# Patient Record
Sex: Female | Born: 1960 | Race: Black or African American | Hispanic: No | Marital: Single | Smoking: Former smoker
Health system: Southern US, Community
[De-identification: ages and names within clinical notes are randomized; demographics above are authoritative.]

## PROBLEM LIST (undated history)

## (undated) DIAGNOSIS — E119 Type 2 diabetes mellitus without complications: Secondary | ICD-10-CM

## (undated) DIAGNOSIS — E785 Hyperlipidemia, unspecified: Secondary | ICD-10-CM

## (undated) DIAGNOSIS — H269 Unspecified cataract: Secondary | ICD-10-CM

## (undated) DIAGNOSIS — E538 Deficiency of other specified B group vitamins: Secondary | ICD-10-CM

## (undated) DIAGNOSIS — I10 Essential (primary) hypertension: Secondary | ICD-10-CM

## (undated) HISTORY — PX: BREAST BIOPSY: SHX20

## (undated) HISTORY — DX: Unspecified cataract: H26.9

## (undated) HISTORY — PX: BILATERAL KNEE ARTHROSCOPY: SUR91

## (undated) HISTORY — DX: Deficiency of other specified B group vitamins: E53.8

## (undated) HISTORY — DX: Hyperlipidemia, unspecified: E78.5

## (undated) HISTORY — PX: CATARACT EXTRACTION W/ INTRAOCULAR LENS IMPLANT: SHX1309

---

## 2002-04-24 ENCOUNTER — Encounter: Payer: Self-pay | Admitting: Emergency Medicine

## 2002-04-24 ENCOUNTER — Emergency Department (HOSPITAL_COMMUNITY): Admission: EM | Admit: 2002-04-24 | Discharge: 2002-04-24 | Payer: Self-pay | Admitting: Emergency Medicine

## 2005-09-10 ENCOUNTER — Ambulatory Visit (HOSPITAL_BASED_OUTPATIENT_CLINIC_OR_DEPARTMENT_OTHER): Admission: RE | Admit: 2005-09-10 | Discharge: 2005-09-10 | Payer: Self-pay | Admitting: Orthopedic Surgery

## 2005-09-10 ENCOUNTER — Ambulatory Visit (HOSPITAL_COMMUNITY): Admission: RE | Admit: 2005-09-10 | Discharge: 2005-09-10 | Payer: Self-pay | Admitting: Orthopedic Surgery

## 2007-03-02 ENCOUNTER — Emergency Department (HOSPITAL_COMMUNITY): Admission: EM | Admit: 2007-03-02 | Discharge: 2007-03-02 | Payer: Self-pay | Admitting: Emergency Medicine

## 2007-09-06 ENCOUNTER — Ambulatory Visit (HOSPITAL_COMMUNITY): Admission: RE | Admit: 2007-09-06 | Discharge: 2007-09-07 | Payer: Self-pay | Admitting: Ophthalmology

## 2009-01-10 ENCOUNTER — Ambulatory Visit (HOSPITAL_COMMUNITY): Admission: RE | Admit: 2009-01-10 | Discharge: 2009-01-11 | Payer: Self-pay | Admitting: Obstetrics and Gynecology

## 2011-04-13 LAB — CBC
Hemoglobin: 9.2 g/dL — ABNORMAL LOW (ref 12.0–15.0)
MCHC: 33 g/dL (ref 30.0–36.0)
RBC: 3.09 MIL/uL — ABNORMAL LOW (ref 3.87–5.11)
WBC: 9.7 10*3/uL (ref 4.0–10.5)

## 2011-04-13 LAB — BASIC METABOLIC PANEL
BUN: 26 mg/dL — ABNORMAL HIGH (ref 6–23)
CO2: 21 mEq/L (ref 19–32)
Calcium: 8.6 mg/dL (ref 8.4–10.5)
Chloride: 108 mEq/L (ref 96–112)
Creatinine, Ser: 1.03 mg/dL (ref 0.4–1.2)
GFR calc Af Amer: >60 mL/min (ref >60)
GFR calc non Af Amer: 58 mL/min — ABNORMAL LOW (ref >60)
Glucose, Bld: 97 mg/dL (ref 70–99)
Potassium: 4.1 mEq/L (ref 3.5–5.1)
Sodium: 141 mEq/L (ref 135–145)

## 2011-04-13 LAB — GLUCOSE, CAPILLARY
Glucose-Capillary: 101 mg/dL — ABNORMAL HIGH (ref 70–99)
Glucose-Capillary: 104 mg/dL — ABNORMAL HIGH (ref 70–99)
Glucose-Capillary: 134 mg/dL — ABNORMAL HIGH (ref 70–99)
Glucose-Capillary: 149 mg/dL — ABNORMAL HIGH (ref 70–99)
Glucose-Capillary: 96 mg/dL (ref 70–99)

## 2011-04-13 LAB — DIFFERENTIAL
Lymphocytes Relative: 23 % (ref 12–46)
Lymphs Abs: 2.2 10*3/uL (ref 0.7–4.0)
Monocytes Absolute: 0.6 10*3/uL (ref 0.1–1.0)
Monocytes Relative: 6 % (ref 3–12)
Neutro Abs: 6 10*3/uL (ref 1.7–7.7)
Neutrophils Relative %: 62 % (ref 43–77)

## 2011-05-12 NOTE — Op Note (Signed)
NAMEJAYLE, Morgan [name] NO.:  [id number]   MEDICAL RECORD NO.:  [id number]          PATIENT TYPE:  AMB   LOCATION:  SDS                          FACILITY:  [facility]   PHYSICIAN:  John D. Ashley Royalty, M.D. DATE OF BIRTH:  June 14, 1961   DATE OF PROCEDURE:  09/06/2007  DATE OF DISCHARGE:                               OPERATIVE REPORT   ADMISSION DIAGNOSIS:  Traction retinal detachment, proliferative  diabetic retinopathy, vitreous hemorrhage, complex traction retinal  detachment, left eye.   PROCEDURES:  Repair of complex traction retinal detachment, pars plana  vitrectomy with membrane peel, retinal photocoagulation in the left eye.   SURGEON:  Alan Mulder, MD   ASSISTANT:  Rosalie Doctor, MA   ANESTHESIA:  General.   DETAILS:  Usual prep and drape, peritomies at 10, 2 and 4 o'clock.  The  5-mm infusion port was anchored into place at 4 o'clock.  Provisc was  placed on the corneal surface and the biome viewing system was moved  into place.  The lighted pick and cutter were placed at 10 and 2  o'clock, respectively.  Pars plana vitrectomy was begun just behind the  crystalline lens.  A cataract was noted.  Large pockets of dark red  blood were encountered and carefully unroofed with the vitreous pick and  cutter.  Fluid was vacuumed from beneath these large pockets and a ring  traction detachment extending from the disk to the lower arcade, to the  temporal arcade, to the superior arcade and back to the disk was  encountered.  This large fibrous ring was causing traction retinal  detachment.  A macular hole was present.  The [facility] scissors were used to  incise the ring.  [employer] scissors were used to remove small  adhesions of the ring to the retina itself.  The serrated forceps were  used to and the membrane was grasped by its attachment at the disk and  peeled toward the temporal side.  The vitreous cutter was used to trim  the membrane down to the retinal  surface after it had been incised  multiple times.  [employer] diamond-dusted membrane scraper was  used to remove the internal limiting membrane around the macular hole.  The vitrectomy was carried out into the periphery and down to the  vitreous base for 360 degrees.  All blood and vitreous were removed.  The endolaser was positioned in the eye and 981 burns were placed around  the retinal periphery at a power of 1000 milliwatts, 1000 microns each  and 0.1 seconds each.  A washout procedure was performed and the [employer] brush was used to vacuum blood from the retinal surface.  The instruments were removed from the eye and 9-0 nylon was used to  close the sclerotomy sites.  The conjunctiva was closed with wet-field  cautery.  Polymyxin and gentamicin were irrigated into Tenon's space.  Atropine solution was applied.  Marcaine was injected  around the globe for postop pain.  Decadron 10 mg was injected into the  lower subconjunctival space.  TobraDex ophthalmic  ointment, a patch and  shield were placed.  The patient was awakened and taken to Recovery in  satisfactory condition.      Beulah Gandy. Ashley Royalty, M.D.  Electronically Signed     JDM/MEDQ  D:  09/06/2007  T:  09/07/2007  Job:  [id number]

## 2011-05-12 NOTE — Op Note (Signed)
[name], [name] NO.:  [id number]   MEDICAL RECORD NO.:  [id number]          PATIENT TYPE:  AMB   LOCATION:  SDS                          FACILITY:  [facility]   PHYSICIAN:  John D. Ashley Royalty, M.D. DATE OF BIRTH:  [birth year]   DATE OF PROCEDURE:  01/10/2009  DATE OF DISCHARGE:                               OPERATIVE REPORT   ADMISSION DIAGNOSES:  Traction retinal detachment, vitreous hemorrhage,  proliferative diabetic retinopathy, and complex retinal detachment,  right eye.   PROCEDURES:  Repair of complex retinal detachment, right eye with pars  plana vitrectomy, membrane peel, retinal photocoagulation, and gas-fluid  exchange.   SURGEON:  Beulah Gandy. Ashley Royalty, MD   ASSISTANT:  Rosalie Doctor, MA   ANESTHESIA:  General.   PROCEDURE IN DETAIL:  After usual prep and drape, a 25-gauge trocar was  placed at 8, 10, and 2 o'clock.  Infusion at 8 o'clock.  Provisc placed  on the corneal surface and the BIOM viewing system moved into place.  Pars plana vitrectomy was begun just behind the cataractous lens.  The  view was difficult because of the cataract.  Blood mixed with vitreous  was then encountered.  This was carefully removed under low suction and  rapid cutting.  As the posterior segment came into view, two large areas  of traction detachment were seen on the temporal side of the macula.  There was surface proliferation with dense vitreous hemorrhage and  neovascularization in these complexes.  The complexes were treated with  the vitreous cutter and the lighted pick was used to engage the vitreous  surface with forceps.  A 25-gauge forceps were used to grasp the  vitreous hyaloid as the cutter and pick were used to peel these vitreous  and neovascular tissue from the retina itself.  This was done in the  superotemporal area and then in the inferotemporal area.  These areas  were attached to each other and then they attached to the disk.  The  surface  proliferation was removed all the way to the disk, uncovering  the foveal region.  Once the proliferative areas were removed, the  endolaser was positioned in the eye and 920 burns were placed to obtain  hemostasis and panretinal photocoagulation.  The power was 1000  milliwatts, 1000 microns each and 0.1 seconds each.  The vitrectomy was  carried out into the periphery down to the vitreous base for 360  degrees.  It was trimmed in this area.  Scleral depression was used to  gain access to this region and blood was removed from the far periphery.  A subtotal gas-fluid exchange was then carried out through the infusion  port and suctioned.  The silicone-tip suction line was used to vacuum  blood from the retinal surface.  The instruments were removed from the  eye and additional gas was injected to obtain a closing pressure of 10  with a Barraquer keratometer.  The conjunctiva was allowed to slide over  the scleral wounds and the wounds were found to be tight.  Polymyxin and  gentamicin were  irrigated into Tenon space.  Atropine solution was  applied.  Decadron 10 mg was injected into the lower subconjunctival  space.  Marcaine was injected around the globe for postop pain.  Polysporin ophthalmic ointment, a patch and shield were placed.  The  patient was awakened and taken to recovery in satisfactory condition.   COMPLICATIONS:  None.   DURATION:  40 minutes.      Beulah Gandy. Ashley Royalty, M.D.  Electronically Signed     JDM/MEDQ  D:  01/10/2009  T:  01/11/2009  Job:  [id number]

## 2011-05-15 NOTE — Op Note (Signed)
NAMEKENYADA, Morgan [name] NO.:  [id number]   MEDICAL RECORD NO.:  [id number]          PATIENT TYPE:  AMB   LOCATION:  [facility]C                         FACILITY:  [facility]   PHYSICIAN:  Marlowe Kays, M.D.  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   POSTOPERATIVE DIAGNOSES:  1.  Osteoarthritis and large osteochondral fragment in suprapatellar pouch,      right knee.  2.  Torn medial and lateral menisci.   OPERATIONS:  1.  Right knee arthroscopy with removal of large osteochondral fragment and      burring of large osteophyte from superior medial femoral condyle.  2.  Partial medial and lateral meniscectomy.   SURGEON:  Marlowe Kays, M.D.   ASSISTANT:  Nurse.   ANESTHESIA:  General.   JUSTIFICATION FOR PROCEDURE:  She has a history of bilateral knee  arthroscopies back in the 1980s.  Recently she has had pain and swelling in  the knee and can feel something moving in the paramedial joint which on x-  ray which is a large osteochondral fragment.   DESCRIPTION OF PROCEDURE:  She has a history of bilateral knee arthroscopies  back and 98 days was this had pain, swelling and they with and can feel  something moving in the superior medial joint which on x-ray used a large  osteochondral fragment.  She is here today for removal of the fragment with  other treatment as necessary for the interior of the knee joint.  She and  her  family understand that she has a very arthritic knee.   DESCRIPTION OF PROCEDURE:  Satisfactory general anesthesia, pneumatic  tourniquet.  Right leg was Esmarch out nonsterilely, prepped with DuraPrep  down to the ankle and draped in a sterile field.  Thigh stabilizer employed.  Knee support and Ace wrap to the left leg.  Superior and medial  saline  inflow first through an anterolateral portal, the medial compartment of the  knee joint was evaluated.  She had good bit of synovitis present which I  resected with 3.5 mm shaver.  She had several areas of superficial tears  along the residual medial meniscus which I shaved down until smooth.  She  had full-thickness wear of the posterior medial tibial plateau and a large  defect on the medial femoral condyle which was most likely the source of the  osteochondral fragment.  It seemed to match up reasonably well.  Looking at  the medial gutter and suprapatellar area, she had a large osteophyte off the  superior medial femoral condyle and a large free fragment in the joint.  There was some minimal wear of the patella.  I  elected to go ahead and  finish the lateral compartment of the knee joint first for reasons noted  below.  I then switched portals laterally.  She also had a large amount of  synovitis present with  fairly extensive tearing of the lateral meniscus  which I pictured and then shaved down the meniscus to a stable rim as well  as cleaning up the synovitis with some minimal shaving of the lateral  femoral condyle.  We then reversed portals once again and enlarged the  anterior and medial portal to allow for better retrieval of the large  fragment.  I then introduced a 4-0 oval bur and burred down the large  osteophyte off the medial femoral condyle until it was smooth.  I then was  able to grasp the osteochondral fragment which was actually too large for  the pituitary rongeur and bring it down into the medial compartment,  slightly enlarging the medial portal with knife blade.  I was unable to  bring to deliver it with the fragment breaking down and through with  pituitary pressure.  I tried using a larger more aggressive shaver but was  unable to really remove it making progress with it.  I then went and used  the bur, breaking the fragment down in smaller pieces which I  was then able  to retrieve piecemeal with the pituitary until all of it had been removed.  I then placed the shaver back in the joint and used it throughout the joint  be sure that there were no loose fragments floating around.  The knee joint  was then irrigated until clear and all fluid possible removed.  The two  anterior portals were closed with 4-0 nylon.  Through the inflow apparatus,  I injected 20 mL of 0.5% Marcaine with Adrenalin, 4 mg of morphine.  I then  sutured this portal with 4-0 nylon as well.  Betadine, Adaptic and dry  sterile dressing were applied and tourniquet was released.  She tolerated  the procedure well and was taken to recovery in satisfactory condition with  no known complication.           ______________________________  Marlowe Kays, M.D.     JA/MEDQ  D:  09/10/2005  T:  09/10/2005  Job:  [id number]

## 2011-10-09 LAB — BASIC METABOLIC PANEL
CO2: 28
Calcium: 9.5
Creatinine, Ser: 0.74
GFR calc Af Amer: >60

## 2011-10-09 LAB — CBC
MCHC: 34.2
RBC: 3.78 — ABNORMAL LOW

## 2011-11-02 ENCOUNTER — Ambulatory Visit (INDEPENDENT_AMBULATORY_CARE_PROVIDER_SITE_OTHER): Payer: Self-pay | Admitting: Ophthalmology

## 2012-12-21 ENCOUNTER — Encounter (HOSPITAL_COMMUNITY): Payer: Self-pay | Admitting: *Deleted

## 2012-12-21 ENCOUNTER — Emergency Department (HOSPITAL_COMMUNITY)
Admission: EM | Admit: 2012-12-21 | Discharge: 2012-12-21 | Disposition: A | Discharge status: Home / Self Care | Payer: BC Managed Care – PPO | Attending: Emergency Medicine | Admitting: Emergency Medicine

## 2012-12-21 DIAGNOSIS — E119 Type 2 diabetes mellitus without complications: Secondary | ICD-10-CM

## 2012-12-21 DIAGNOSIS — R61 Generalized hyperhidrosis: Secondary | ICD-10-CM | POA: Insufficient documentation

## 2012-12-21 DIAGNOSIS — I10 Essential (primary) hypertension: Secondary | ICD-10-CM

## 2012-12-21 DIAGNOSIS — R110 Nausea: Secondary | ICD-10-CM | POA: Insufficient documentation

## 2012-12-21 DIAGNOSIS — Z79899 Other long term (current) drug therapy: Secondary | ICD-10-CM | POA: Insufficient documentation

## 2012-12-21 HISTORY — DX: Type 2 diabetes mellitus without complications: E11.9

## 2012-12-21 HISTORY — DX: Essential (primary) hypertension: I10

## 2012-12-21 LAB — GLUCOSE, CAPILLARY: Glucose-Capillary: 532 mg/dL — ABNORMAL HIGH (ref 70–99)

## 2012-12-21 MED ORDER — LISINOPRIL 20 MG PO TABS: 20.0000 mg | ORAL_TABLET | Freq: Every day | ORAL | Status: DC

## 2012-12-21 MED ORDER — LISINOPRIL 5 MG PO TABS
5.0000 mg | ORAL_TABLET | Freq: Once | ORAL | Status: AC
  Administered 2012-12-21: 5 mg via ORAL
  Filled 2012-12-21: qty 1

## 2012-12-21 MED ORDER — GLIMEPIRIDE 2 MG PO TABS
2.0000 mg | ORAL_TABLET | Freq: Every day | ORAL | Status: DC
  Administered 2012-12-21: 2 mg via ORAL
  Filled 2012-12-21 (×2): qty 1

## 2012-12-21 MED ORDER — METFORMIN HCL 500 MG PO TABS
1000.0000 mg | ORAL_TABLET | Freq: Once | ORAL | Status: AC
  Administered 2012-12-21: 1000 mg via ORAL
  Filled 2012-12-21: qty 2

## 2012-12-21 MED ORDER — METFORMIN HCL 500 MG PO TABS: 500.0000 mg | ORAL_TABLET | Freq: Two times a day (BID) | ORAL | Status: DC

## 2012-12-21 MED ORDER — GLIMEPIRIDE 2 MG PO TABS: 2.0000 mg | ORAL_TABLET | Freq: Every day | ORAL | Status: DC

## 2012-12-21 NOTE — ED Provider Notes (Signed)
History  This chart was scribed for Donnetta Hutching, MD by Manuela Schwartz, ED scribe. This patient was seen in room APA09/APA09 and the patient's care was started at 1438.   CSN: [id number]  Arrival date & time 12/21/12  1438   First MD Initiated Contact with Patient 12/21/12 1513      Chief Complaint  Patient presents with  . Hyperglycemia   Patient is a 51 y.o. female presenting with diabetes problem. The history is provided by the patient. No language interpreter was used.  Diabetes She presents for her initial diabetic visit. Her disease course has been fluctuating. Hypoglycemia symptoms include sweats. Pertinent negatives for hypoglycemia include no confusion or seizures. Pertinent negatives for diabetes include no blurred vision, no visual change and no weakness. Symptoms are stable. Symptoms have been present for 1 day. Risk factors for coronary artery disease include diabetes mellitus. When asked about current treatments, none were reported. She is compliant with treatment some of the time. Her home blood glucose trend is increasing steadily. Her highest blood glucose is >200 mg/dl.   Morgan [name] is a 51 y.o. female who presents to the Emergency Department complaining of elevated blood sugar today associated with not taking her Rx lately b/c she didn't get them filled. She usually takes metformin 500 mg x 2/day, Glimepiride 2 mg daily, lisinopril 5 mg daily. She states she checked her BS b/c began feeling diaphoretic and nauseated, no emesis. CBG was 512 at home and 532 here at triage.  Her PCP is Dr. Regino Schultze Past Medical History  Diagnosis Date  . Diabetes mellitus without complication   . Hypertension     History reviewed. No pertinent past surgical history.  History reviewed. No pertinent family history.  History  Substance Use Topics  . Smoking status: Never Smoker   . Smokeless tobacco: Not on file  . Alcohol Use: No    OB History    Grav Para Term Preterm Abortions TAB SAB  Ect Mult Living                  Review of Systems  Constitutional: Negative for fever and chills.       512 CBG at home  Eyes: Negative for blurred vision.  Respiratory: Negative for shortness of breath.   Gastrointestinal: Negative for nausea and vomiting.  Neurological: Negative for seizures and weakness.  Psychiatric/Behavioral: Negative for confusion.  All other systems reviewed and are negative.    Allergies  Aspirin  Home Medications   Current Outpatient Rx  Name  Route  Sig  Dispense  Refill  . B-12 1000 MCG PO CAPS   Oral   Take 1 capsule by mouth daily.         Marland Kitchen GLIMEPIRIDE 2 MG PO TABS   Oral   Take 2 mg by mouth daily before breakfast.         . LISINOPRIL 5 MG PO TABS   Oral   Take 5 mg by mouth daily.         Marland Kitchen METFORMIN HCL 500 MG PO TABS   Oral   Take 500 mg by mouth 2 (two) times daily with a meal.           Triage Vitals: BP 177/81  Pulse 83  Temp 97.9 F (36.6 C) (Oral)  Resp 18  Ht 5\' 7"  (1.702 m)  Wt 172 lb (78.019 kg)  BMI 26.94 kg/m2  SpO2 100%  LMP 11/24/2012  Physical Exam  Nursing  note and vitals reviewed. Constitutional: She is oriented to person, place, and time. She appears well-developed and well-nourished.  HENT:  Head: Normocephalic and atraumatic.  Eyes: Conjunctivae normal and EOM are normal. Pupils are equal, round, and reactive to light.  Neck: Normal range of motion. Neck supple.  Cardiovascular: Normal rate, regular rhythm and normal heart sounds.   Pulmonary/Chest: Effort normal and breath sounds normal.  Abdominal: Soft. Bowel sounds are normal.  Musculoskeletal: Normal range of motion.  Neurological: She is alert and oriented to person, place, and time.  Skin: Skin is warm and dry.  Psychiatric: She has a normal mood and affect.    ED Course  Procedures (including critical care time) DIAGNOSTIC STUDIES: Oxygen Saturation is 100% on room air, normal by my interpretation.    COORDINATION OF  CARE: At 340 PM Discussed treatment plan with patient which includes CGB, glimepiride (AMARYL) tablet 2 mg, lisinopril (PRINIVIL,ZESTRIL) tablet 5 mg, metFORMIN (GLUCOPHAGE) tablet 1,000 mg. Patient agrees.         Labs Reviewed  GLUCOSE, CAPILLARY - Abnormal; Notable for the following:    Glucose-Capillary 532 (*)     All other components within normal limits   No results found.   No diagnosis found.    MDM   Patient does not feel ill at this time. She has not been taking her oral hypoglycemics. She understands the importance of this.  Refill metformin, Amaryl, Lisinopril.  Patient and her sister understand treatment plan.  First dose of medication given in emergency department     I personally performed the services described in this documentation, which was scribed in my presence. The recorded information has been reviewed and is accurate.          Donnetta Hutching, MD 12/21/12 [phone]

## 2012-12-21 NOTE — ED Notes (Signed)
amaryl being obtained from pharmacy

## 2012-12-21 NOTE — ED Notes (Signed)
Pt checked sugar and first time registered too high and second time in 500's

## 2012-12-21 NOTE — ED Notes (Signed)
CBG in triage 532, pt states she has not taken diabetes med like she is suppose to

## 2013-03-01 ENCOUNTER — Other Ambulatory Visit (HOSPITAL_COMMUNITY): Payer: Self-pay | Admitting: Physician Assistant

## 2013-03-01 DIAGNOSIS — Z0000 Encounter for general adult medical examination without abnormal findings: Secondary | ICD-10-CM

## 2013-03-06 ENCOUNTER — Ambulatory Visit (HOSPITAL_COMMUNITY): Payer: BC Managed Care – PPO

## 2013-03-07 ENCOUNTER — Telehealth: Payer: Self-pay

## 2013-03-07 NOTE — Telephone Encounter (Signed)
LMOM to call.

## 2013-03-09 NOTE — Telephone Encounter (Signed)
LMOM to call.

## 2013-03-10 NOTE — Telephone Encounter (Signed)
Letter to pt

## 2013-03-13 ENCOUNTER — Ambulatory Visit (HOSPITAL_COMMUNITY)
Admission: RE | Admit: 2013-03-13 | Discharge: 2013-03-13 | Disposition: A | Discharge status: Home / Self Care | Payer: BC Managed Care – PPO | Source: Ambulatory Visit | Attending: Physician Assistant | Admitting: Physician Assistant

## 2013-03-13 DIAGNOSIS — Z1231 Encounter for screening mammogram for malignant neoplasm of breast: Secondary | ICD-10-CM | POA: Insufficient documentation

## 2013-03-13 DIAGNOSIS — Z0000 Encounter for general adult medical examination without abnormal findings: Secondary | ICD-10-CM

## 2013-03-15 NOTE — Telephone Encounter (Signed)
Letter to PCP

## 2013-04-03 ENCOUNTER — Encounter: Payer: Self-pay | Admitting: *Deleted

## 2013-04-17 ENCOUNTER — Encounter (HOSPITAL_COMMUNITY): Payer: BC Managed Care – PPO | Attending: Oncology | Admitting: Oncology

## 2013-04-17 ENCOUNTER — Encounter (HOSPITAL_COMMUNITY): Payer: Self-pay | Admitting: Oncology

## 2013-04-17 VITALS — BP 145/85 | HR 91 | Temp 98.3°F | Resp 20 | Wt 162.6 lb

## 2013-04-17 DIAGNOSIS — D638 Anemia in other chronic diseases classified elsewhere: Secondary | ICD-10-CM | POA: Insufficient documentation

## 2013-04-17 DIAGNOSIS — I10 Essential (primary) hypertension: Secondary | ICD-10-CM | POA: Insufficient documentation

## 2013-04-17 DIAGNOSIS — E538 Deficiency of other specified B group vitamins: Secondary | ICD-10-CM

## 2013-04-17 DIAGNOSIS — E119 Type 2 diabetes mellitus without complications: Secondary | ICD-10-CM | POA: Insufficient documentation

## 2013-04-17 DIAGNOSIS — N289 Disorder of kidney and ureter, unspecified: Secondary | ICD-10-CM | POA: Insufficient documentation

## 2013-04-17 DIAGNOSIS — E785 Hyperlipidemia, unspecified: Secondary | ICD-10-CM | POA: Insufficient documentation

## 2013-04-17 DIAGNOSIS — D649 Anemia, unspecified: Secondary | ICD-10-CM

## 2013-04-17 HISTORY — DX: Deficiency of other specified B group vitamins: E53.8

## 2013-04-17 LAB — CBC WITH DIFFERENTIAL/PLATELET
Basophils Absolute: 0 10*3/uL (ref 0.0–0.1)
Eosinophils Relative: 3 % (ref 0–5)
HCT: 32.2 % — ABNORMAL LOW (ref 36.0–46.0)
Hemoglobin: 10.6 g/dL — ABNORMAL LOW (ref 12.0–15.0)
Lymphocytes Relative: 24 % (ref 12–46)
MCHC: 32.9 g/dL (ref 30.0–36.0)
MCV: 91.5 fL (ref 78.0–100.0)
Monocytes Absolute: 0.4 10*3/uL (ref 0.1–1.0)
Monocytes Relative: 7 % (ref 3–12)
Neutro Abs: 4 10*3/uL (ref 1.7–7.7)
RDW: 13.8 % (ref 11.5–15.5)
WBC: 6.1 10*3/uL (ref 4.0–10.5)

## 2013-04-17 LAB — RETICULOCYTES: Retic Count, Absolute: 95 10*3/uL (ref 19.0–186.0)

## 2013-04-17 LAB — COMPREHENSIVE METABOLIC PANEL
ALT: 6 U/L (ref 0–35)
AST: 14 U/L (ref 0–37)
Alkaline Phosphatase: 46 U/L (ref 39–117)
CO2: 21 mEq/L (ref 19–32)
Chloride: 104 mEq/L (ref 96–112)
GFR calc Af Amer: 45 mL/min — ABNORMAL LOW (ref >90)
GFR calc non Af Amer: 39 mL/min — ABNORMAL LOW (ref >90)
Glucose, Bld: 86 mg/dL (ref 70–99)
Sodium: 135 mEq/L (ref 135–145)
Total Bilirubin: 0.1 mg/dL — ABNORMAL LOW (ref 0.3–1.2)

## 2013-04-17 NOTE — Patient Instructions (Addendum)
[facility] Discharge Instructions  RECOMMENDATIONS MADE BY THE CONSULTANT AND ANY TEST RESULTS WILL BE SENT TO YOUR REFERRING PHYSICIAN.  HOLD all vitamin B12 supplements. Lab work today. We will call you with any abnormal results. Return to clinic in 5 months for lab work. Return to clinic to see physician after 5 month lab work.  Thank you for choosing [facility] to provide your oncology and hematology care.  To afford each patient quality time with our providers, please arrive at least 15 minutes before your scheduled appointment time.  With your help, our goal is to use those 15 minutes to complete the necessary work-up to ensure our physicians have the information they need to help with your evaluation and healthcare recommendations.    Effective January 1st, 2014, we ask that you re-schedule your appointment with our physicians should you arrive 10 or more minutes late for your appointment.  We strive to give you quality time with our providers, and arriving late affects you and other patients whose appointments are after yours.    Again, thank you for choosing [facility].  Our hope is that these requests will decrease the amount of time that you wait before being seen by our physicians.       _____________________________________________________________  Should you have questions after your visit to [facility], please contact our office at [phone] between the hours of 8:30 a.m. and 5:00 p.m.  Voicemails left after 4:30 p.m. will not be returned until the following business day.  For prescription refill requests, have your pharmacy contact our office with your prescription refill request.

## 2013-04-17 NOTE — Progress Notes (Signed)
[facility] NEW PATIENT EVALUATION   Name: Morgan [name] Date: 04/17/2013 MRN: [id number] DOB: 1961-02-09    CC: Morgan Ruths, MD  Morgan Herald, PA-C   DIAGNOSIS: The encounter diagnosis was Disorder of vitamin B12.   HISTORY OF PRESENT ILLNESS:Morgan [name] is a 52 y.o.African American female who is referred to the [facility] for lab work performed on 02/28/2013 showing an elevation of Vitamin B12 of 1942.  She also has a past medical history significant for diabetes mellitus type 2, HTN, dyslipidemia.  Morgan [name] reports that this is the first time she has heard that her Vitamin B12 level is abnormal.  She is frightened today due to her appt at the [facility].  She reports that she feels well.  She denies any fevers, chills, night sweats, unintentional weight loss, early satiety, and a decreased appetite.  Negative B symptom ROS questioning.   She denies any headaches, dizziness, double vision, nausea, vomiting, diarrhea,. Constipation, chest pain, heart palpitations, abdominal pain, blood in stool, black tarry stool, urinary pain/burning/frequency, hematuria, and hemoptysis.   She admits that she was taking 1000 mcg of Vitamin B12 daily for many years.  She has stopped since her lab results in March 2014.  She reports that she start taking B12 at her mother suggestion when her mother was placed on vitamin B12 orally by her primary care physician for "energy". She reports that the vitamin B12 orally was giving her more energy and therefore she continued with this supplementation.  She does admit to weight loss of 7 lbs x 1 month but she is trying to lose weight.  She is accomplishing this by eating smaller portions of meals.    She is been a diabetic for approximately 6 years. She is trying to make healthier eating choices and lose weight. She reports that her goal weight is 150 pounds but she used to weigh is much is 215 pounds. She weighs in today in 162  pounds.  ROS questioning is negative. Hematologically she denies any complaints.   FAMILY HISTORY:  Mother 62 and alive with HTN and DM Father deceased at age of 70 from a stroke; he also had DM and HTN Brother 21 with HTN and DM Brother 8 with HTN Sister 61 and healthy Sister 40 and healthy  No children of her own.   PAST MEDICAL HISTORY:  has a past medical history of Diabetes mellitus without complication; Hypertension; Hyperlipidemia; Cataract; and Disorder of vitamin B12 (04/17/2013).     CURRENT MEDICATIONS:  Janumet 50-1000 mg BID Invokana 100 mg daily Simvastatin 20 mg daily Lisinopril 10/12.5   SOCIAL HISTORY:  Patient is from [place], [place] where she presently lives today with her Mother.  She completed her 1st year of college at [employer] but got a job at [employer] and did not go back to school.  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  She has never been married.  She admits to EtOH use in the past drinking 6 beers per week quitting 5-6 years ago and now only drinks EtOh occasionally. She admits to a history of tobacco abuse smoking 1 ppd x 15 years quit now.  She denies any illicit drug abuse.    ALLERGIES: Aspirin which causes abdominal pain.  GYN History: Menarche at age 54 [facility] April 06, 2013 [id number] Up-to-date on mammogram with last exam being on 03/10/2013 PAP smear is up-to-date with next one being next week.  LABORATORY DATA:  WBC 9.4 Hgb 11.4 HCT 34.3 MCV 87.3 Plt 308 Glucose297 BUN 26 Creatinine 1.27 Iron 68 TIBC 341 % Sat 20 Ferritin 53 Folate >20 Vitamin B12 1941   RADIOGRAPHY: 03/10/2013  *RADIOLOGY REPORT*  Clinical Data: Screening.  DIGITAL BILATERAL SCREENING MAMMOGRAM WITH CAD  Comparison: None.  FINDINGS:  ACR Breast Density Category 4: The breast tissue is extremely  dense.  No suspicious masses, architectural distortion, or calcifications  are present.  Images were processed with  CAD.  IMPRESSION:  No mammographic evidence of malignancy.  A result letter of this screening mammogram will be mailed directly  to the patient.  RECOMMENDATION:  Screening mammogram in one year. (Code:[id number]-B-01Y)  BI-RADS CATEGORY 1: Negative.  Original Report Authenticated By: Sherian Rein, M.D.      REVIEW OF SYSTEMS: Patient reports no health concerns.   PHYSICAL EXAM:  weight is 162 lb 9.6 oz (73.755 kg). Her oral temperature is 98.3 F (36.8 C). Her blood pressure is 145/85 and her pulse is 91. Her respiration is 20.  General appearance: alert, cooperative, no distress and appears younger than stated age Head: Normocephalic, without obvious abnormality, atraumatic Neck: no adenopathy, supple, symmetrical, trachea midline and thyroid not enlarged, symmetric, no tenderness/mass/nodules Lymph nodes: Cervical, supraclavicular, and axillary nodes normal. Resp: clear to auscultation bilaterally and normal percussion bilaterally Back: symmetric, no curvature. ROM normal. No CVA tenderness. Cardio: regular rate and rhythm, S1, S2 normal, no murmur, click, rub or gallop GI: soft, non-tender; bowel sounds normal; no masses,  no organomegaly Extremities: extremities normal, atraumatic, no cyanosis or edema Neurologic: Alert and oriented X 3, normal strength and tone. Normal symmetric reflexes. Normal coordination and gait     IMPRESSION:  1. Elevated B12 Level, while on Vitamin B12 1000 mcg PO daily.  She held this Vitamin x 4 weeks now. 2. Anemia, anemia of chronic disease 3. DM type 2, managed by PCP 4. HTN 5. Dyslipidemia 6. Renal insufficiency   PLAN:  1. I personally reviewed and went over laboratory results with the patient. 2. I personally reviewed and went over radiographic studies with the patient. 3. Lab work today: CBC diff, CMET, B12, Retic, GGT 4. Patient education regarding elevated B12 level from extrinsic supplementation. 5. patient has a hold vitamin B12 for 5  months and we will repeat laboratory work at that time. 6. Lab work in 5 months time: CBC differential, complete metabolic panel, vitamin B12 level, and reticulocyte count. 7. Return in 5 months for followup.  All questions were answered. The patient knows to call the clinic with any problems, questions, or concerns.  Patient and plan discussed with Dr. Glenford Peers and he is in agreement with the aforementioned. Patient seen and examined by Dr. Glenford Peers as well.  Kendel Bessey

## 2013-04-17 NOTE — Progress Notes (Signed)
Morgan [name] presented for labwork. Labs per MD order drawn via Peripheral Line 23 gauge needle inserted in left antecubital.  Good blood return present. Procedure without incident.  Needle removed intact. Patient tolerated procedure well.

## 2013-04-19 ENCOUNTER — Other Ambulatory Visit (HOSPITAL_COMMUNITY)
Admission: RE | Admit: 2013-04-19 | Discharge: 2013-04-19 | Disposition: A | Discharge status: Home / Self Care | Payer: BC Managed Care – PPO | Source: Ambulatory Visit | Attending: Obstetrics and Gynecology | Admitting: Obstetrics and Gynecology

## 2013-04-19 ENCOUNTER — Encounter: Payer: Self-pay | Admitting: Obstetrics and Gynecology

## 2013-04-19 ENCOUNTER — Ambulatory Visit (INDEPENDENT_AMBULATORY_CARE_PROVIDER_SITE_OTHER): Payer: BC Managed Care – PPO | Admitting: Obstetrics and Gynecology

## 2013-04-19 VITALS — BP 152/70 | Ht 67.0 in | Wt 166.4 lb

## 2013-04-19 DIAGNOSIS — Z0000 Encounter for general adult medical examination without abnormal findings: Secondary | ICD-10-CM

## 2013-04-19 DIAGNOSIS — Z01419 Encounter for gynecological examination (general) (routine) without abnormal findings: Secondary | ICD-10-CM

## 2013-04-19 DIAGNOSIS — N6320 Unspecified lump in the left breast, unspecified quadrant: Secondary | ICD-10-CM

## 2013-04-19 DIAGNOSIS — E119 Type 2 diabetes mellitus without complications: Secondary | ICD-10-CM | POA: Insufficient documentation

## 2013-04-19 DIAGNOSIS — Z1151 Encounter for screening for human papillomavirus (HPV): Secondary | ICD-10-CM | POA: Insufficient documentation

## 2013-04-19 DIAGNOSIS — Z1212 Encounter for screening for malignant neoplasm of rectum: Secondary | ICD-10-CM

## 2013-04-19 LAB — HEMOCCULT GUIAC POC 1CARD (OFFICE): Fecal Occult Blood, POC: NEGATIVE

## 2013-04-19 NOTE — Patient Instructions (Signed)
Keep appointment for left diagnostic mammogram

## 2013-04-19 NOTE — Progress Notes (Signed)
Subjective:     Morgan [name] is a 52 y.o. female here for a routine exam.  Current complaints: none. Last Pap smear 15 years ago. Mammogram done earlier this year reported as normal, [facility]  Personal health questionnaire reviewed: no.   Gynecologic History Patient's last menstrual period was 04/06/2013. lasting 5 days Contraception: abstinence Last Pap: 1990's. Results were: normal Last mammogram: 2014. Results were: normal SEE EXAM TODAY Obstetric History OB History   Grav Para Term Preterm Abortions TAB SAB Ect Mult Living                     Review of Systems Pertinent items are noted in HPI.  stable diabetic management last hemoglobin A1c reported as approximately 8   Objective:    BP 152/70  Ht 5\' 7"  (1.702 m)  Wt 166 lb 6.4 oz (75.479 kg)  BMI 26.06 kg/m2  LMP 04/06/2013  General Appearance:    Alert, cooperative, no distress, appears stated age  Head:    Normocephalic, without obvious abnormality, atraumatic  Eyes:    PERRL, conjunctiva/corneas clear, EOM's intact, fundi    benign, both eyes  Ears:    Normal TM's and external ear canals, both ears  Nose:   Nares normal, septum midline, mucosa normal, no drainage    or sinus tenderness  Throat:   Lips, mucosa, and tongue normal; teeth and gums normal  Neck:   Supple, symmetrical, trachea midline, no adenopathy;    thyroid:  no enlargement/tenderness/nodules; no carotid   bruit or JVD  Back:     Symmetric, no curvature, ROM normal, no CVA tenderness  Lungs:     Clear to auscultation bilaterally, respirations unlabored  Chest Wall:    No tenderness or deformity   Heart:    Regular rate and rhythm, S1 and S2 normal, no murmur, rub   or gallop  Breast Exam:    Left breast has 2 concerns, first a deep LUOQ firm hard mobile mass just outside the areolar margin, without axillary adenotpathy, not known to patient. No dimpling  Or retraction.   Also, a 1.5 cm sebaceous cyst on underside of breast just beneath  skin, unchanged x yrs.  Abdomen:     Soft, non-tender, bowel sounds active all four quadrants,    no masses, no organomegaly  Genitalia:    Normal female without lesion, discharge or tenderness  Rectal:    Normal tone, normal prostate, no masses or tenderness;   guaiac negative stool  Extremities:   Extremities normal, atraumatic, no cyanosis or edema  Pulses:   2+ and symmetric all extremities  Skin:   Skin color, texture, turgor normal, no rashes or lesions  Lymph nodes:   Cervical, supraclavicular, and axillary nodes normal  Neurologic:   CNII-XII intact, normal strength, sensation and reflexes    throughout      Assessment:    Healthy female exam.   Left breast mass Left breast sebaceous cyst. Pap done Plan:    Mammogram ordered. diagnostic. left breast

## 2013-05-03 ENCOUNTER — Ambulatory Visit (HOSPITAL_COMMUNITY)
Admission: RE | Admit: 2013-05-03 | Discharge: 2013-05-03 | Disposition: A | Discharge status: Home / Self Care | Payer: BC Managed Care – PPO | Source: Ambulatory Visit | Attending: Obstetrics and Gynecology | Admitting: Obstetrics and Gynecology

## 2013-05-03 ENCOUNTER — Other Ambulatory Visit: Payer: Self-pay | Admitting: Obstetrics and Gynecology

## 2013-05-03 DIAGNOSIS — N6320 Unspecified lump in the left breast, unspecified quadrant: Secondary | ICD-10-CM

## 2013-05-03 DIAGNOSIS — N63 Unspecified lump in unspecified breast: Secondary | ICD-10-CM | POA: Insufficient documentation

## 2013-05-04 ENCOUNTER — Ambulatory Visit (INDEPENDENT_AMBULATORY_CARE_PROVIDER_SITE_OTHER): Payer: BC Managed Care – PPO | Admitting: Obstetrics and Gynecology

## 2013-05-04 ENCOUNTER — Encounter: Payer: Self-pay | Admitting: Obstetrics and Gynecology

## 2013-05-04 ENCOUNTER — Ambulatory Visit: Payer: BC Managed Care – PPO | Admitting: Obstetrics and Gynecology

## 2013-05-04 VITALS — BP 138/72 | Ht 67.0 in | Wt 159.2 lb

## 2013-05-04 DIAGNOSIS — N6320 Unspecified lump in the left breast, unspecified quadrant: Secondary | ICD-10-CM

## 2013-05-04 DIAGNOSIS — N63 Unspecified lump in unspecified breast: Secondary | ICD-10-CM

## 2013-05-04 NOTE — Progress Notes (Signed)
Patient had breast diagnostic mammogram yesterday, results reported as normal. Will f/u in 6 months for follow up mammogram.

## 2013-05-04 NOTE — Patient Instructions (Signed)
Keep mammorgram followup in 6 months as recommended by Radiology

## 2013-09-21 ENCOUNTER — Ambulatory Visit (HOSPITAL_COMMUNITY): Payer: BC Managed Care – PPO

## 2013-09-22 ENCOUNTER — Encounter (HOSPITAL_COMMUNITY): Payer: Self-pay

## 2013-10-02 NOTE — Progress Notes (Signed)
This encounter was created in error - please disregard.

## 2013-10-04 ENCOUNTER — Other Ambulatory Visit: Payer: Self-pay | Admitting: Obstetrics and Gynecology

## 2013-10-04 DIAGNOSIS — Z09 Encounter for follow-up examination after completed treatment for conditions other than malignant neoplasm: Secondary | ICD-10-CM

## 2013-11-06 ENCOUNTER — Other Ambulatory Visit (HOSPITAL_COMMUNITY): Payer: BC Managed Care – PPO

## 2013-11-08 ENCOUNTER — Other Ambulatory Visit: Payer: Self-pay | Admitting: Obstetrics and Gynecology

## 2013-11-08 ENCOUNTER — Ambulatory Visit (HOSPITAL_COMMUNITY)
Admission: RE | Admit: 2013-11-08 | Discharge: 2013-11-08 | Disposition: A | Discharge status: Home / Self Care | Payer: BC Managed Care – PPO | Source: Ambulatory Visit | Attending: Obstetrics and Gynecology | Admitting: Obstetrics and Gynecology

## 2013-11-08 DIAGNOSIS — Z09 Encounter for follow-up examination after completed treatment for conditions other than malignant neoplasm: Secondary | ICD-10-CM

## 2013-11-08 DIAGNOSIS — R922 Inconclusive mammogram: Secondary | ICD-10-CM | POA: Insufficient documentation

## 2014-01-13 IMAGING — US US BREAST*L*
1 series · 2 of 2 positions shown · non-contrast
Comparison: With priors

CLINICAL DATA: Palpable left breast abnormality

DIGITAL DIAGNOSTIC LEFT MAMMOGRAM  WITH CAD AND LEFT BREAST
ULTRASOUND:

[Series 1: us breast*left* · 0.08mm/px · 2 of 2 slices shown]
[im 1/2]
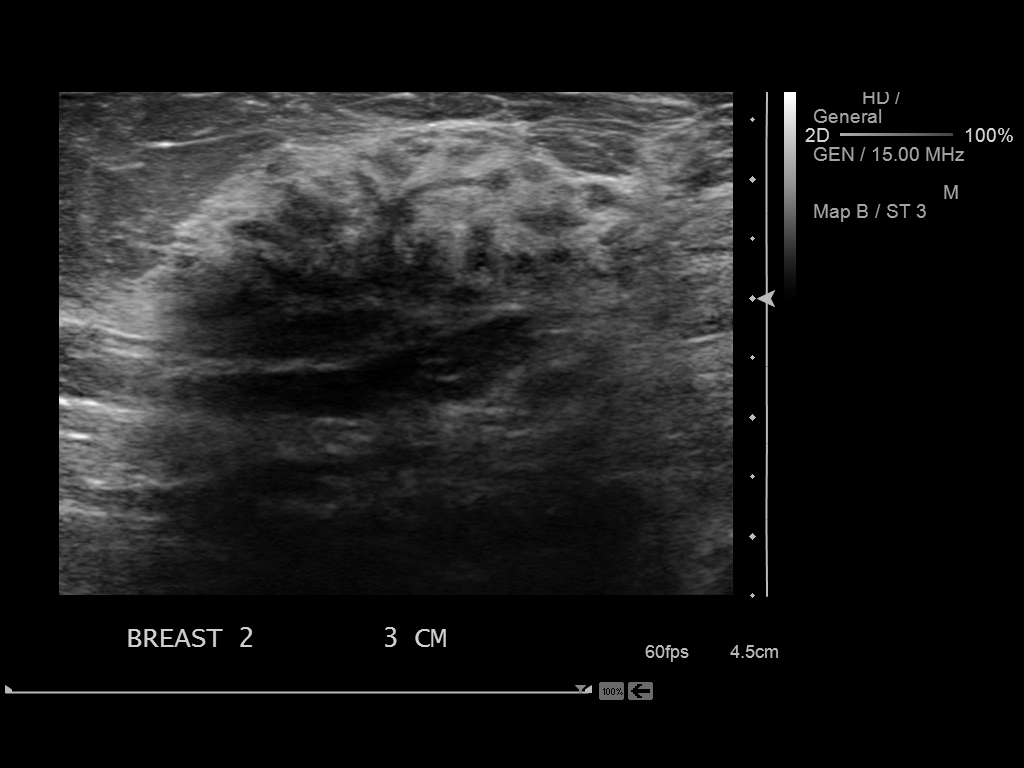
[im 2/2]
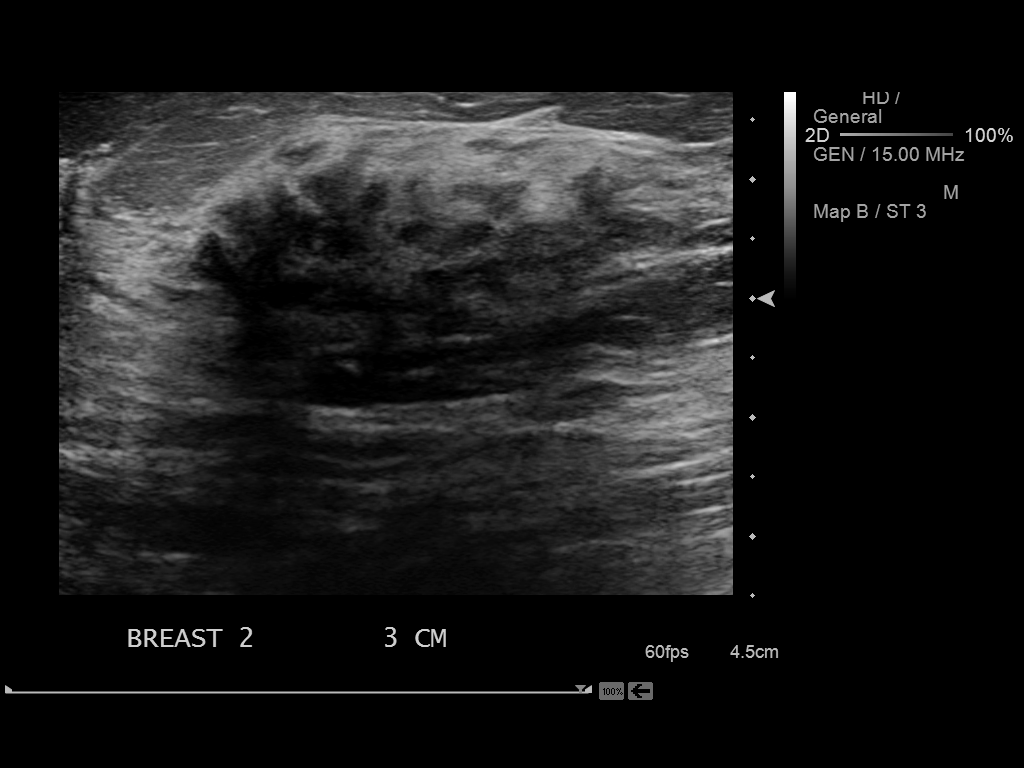

[2 of 2 positions shown; findings below may reference images not displayed]

FINDINGS: ACR Breast Density Category 3: The breast tissue is heterogeneously
dense.

There is no suspicious mass, malignant-type microcalcifications or
distortion.  Stable sebaceous cyst is seen in the lower inner
quadrant of the left breast.  Spot tangential view of the area of
clinical concern in the upper outer quadrant of the left breast
shows dense fibroglandular tissue without a focal mass or
distortion.

Mammographic images were processed with CAD.

On physical exam, I palpate mild thickening in the left breast at 2
o'clock 3 cm from the nipple.

Ultrasound is performed, showing dense fibroglandular tissue with
central fat without a discrete mass, distortion or shadowing.
IMPRESSION: Probable benign dense fibroglandular tissue in the area of clinical
concern in the 2 o'clock region of the left breast.

RECOMMENDATION:
Short-term interval follow-up left breast ultrasound in 6 months is
recommended.  The importance of self breast examination was
discussed with the patient.

I have discussed the findings and recommendations with the patient.
Results were also provided in writing at the conclusion of the
visit.  If applicable, a reminder letter will be sent to the
patient regarding the next appointment.

BI-RADS CATEGORY 3:  Probably benign finding(s) - short interval
follow-up suggested.

## 2014-04-17 ENCOUNTER — Telehealth: Payer: Self-pay

## 2014-04-17 NOTE — Telephone Encounter (Signed)
Pt was referred by Dr. Hassan RowanKoberlein at [facility] for screening colon[facility]. I called and LMOM for a return call.

## 2014-05-16 NOTE — Telephone Encounter (Signed)
Letter to PCP

## 2014-05-23 ENCOUNTER — Other Ambulatory Visit (HOSPITAL_COMMUNITY): Payer: Self-pay | Admitting: Family Medicine

## 2014-05-23 ENCOUNTER — Other Ambulatory Visit (HOSPITAL_COMMUNITY): Payer: Self-pay | Admitting: Radiology

## 2014-05-23 DIAGNOSIS — Z09 Encounter for follow-up examination after completed treatment for conditions other than malignant neoplasm: Secondary | ICD-10-CM

## 2014-06-27 ENCOUNTER — Ambulatory Visit (HOSPITAL_COMMUNITY)
Admission: RE | Admit: 2014-06-27 | Discharge: 2014-06-27 | Disposition: A | Discharge status: Home / Self Care | Payer: BC Managed Care – PPO | Source: Ambulatory Visit | Attending: Family Medicine | Admitting: Family Medicine

## 2014-06-27 ENCOUNTER — Other Ambulatory Visit (HOSPITAL_COMMUNITY): Payer: Self-pay | Admitting: Family Medicine

## 2014-06-27 DIAGNOSIS — N63 Unspecified lump in unspecified breast: Secondary | ICD-10-CM

## 2014-06-27 DIAGNOSIS — Z09 Encounter for follow-up examination after completed treatment for conditions other than malignant neoplasm: Secondary | ICD-10-CM

## 2014-07-03 ENCOUNTER — Encounter (HOSPITAL_COMMUNITY): Payer: Self-pay

## 2014-07-03 ENCOUNTER — Other Ambulatory Visit (HOSPITAL_COMMUNITY): Payer: Self-pay | Admitting: Family Medicine

## 2014-07-03 ENCOUNTER — Ambulatory Visit (HOSPITAL_COMMUNITY)
Admission: RE | Admit: 2014-07-03 | Discharge: 2014-07-03 | Disposition: A | Discharge status: Home / Self Care | Payer: BC Managed Care – PPO | Source: Ambulatory Visit | Attending: Family Medicine | Admitting: Family Medicine

## 2014-07-03 DIAGNOSIS — N63 Unspecified lump in unspecified breast: Secondary | ICD-10-CM

## 2014-07-03 DIAGNOSIS — R229 Localized swelling, mass and lump, unspecified: Principal | ICD-10-CM

## 2014-07-03 DIAGNOSIS — IMO0002 Reserved for concepts with insufficient information to code with codable children: Secondary | ICD-10-CM

## 2014-07-03 MED ORDER — LIDOCAINE HCL (PF) 2 % IJ SOLN
10.0000 mL | Freq: Once | INTRAMUSCULAR | Status: AC
  Administered 2014-07-03: 10 mL

## 2014-07-03 NOTE — Discharge Instructions (Signed)
Breast Biopsy °A breast biopsy is a test during which a sample of tissue is taken from your breast. The breast tissue is looked at under a microscope for cancer cells.  °BEFORE THE PROCEDURE °· Make plans to have someone drive you home after the test. °· Do not smoke for 2 weeks before the test. Stop smoking, if you smoke. °· Do not drink alcohol for 24 hours before the test. °· Wear a good support bra to the test. °PROCEDURE  °You may be given one of the following: °· A medicine to numb the breast area (local anesthesia). °· A medicine to make you sleep (general anesthesia). °There are different types of breast biopsies. They include: °· Fine-needle aspiration. °¨ A needle is put into the breast lump. °¨ The needle takes out fluid and cells from the lump. °¨ Ultrasound imaging may be used to help find the lump and to put the needle it the right spot. °· Core-needle biopsy. °¨ A needle is put into the breast lump. °¨ The needle is put in your breast 3-6 times. °¨ The needle removes breast tissue. °¨ An ultrasound image or X-ray is often used to find the right spot to put in the needle. °· Stereotactic biopsy. °¨ X-rays and a computer are used to study X-ray pictures of the breast lump. °¨ The computer finds where the needle needs to be put into the breast. °¨ Tissue samples are taken out. °· Vacuum-assisted biopsy. °¨ A small cut (incision) is made in your breast. °¨ A biopsy device is put through the cut and into the breast tissue. °¨ The biopsy device draws abnormal breast tissue into the biopsy device. °¨ A large tissue sample is often removed. °¨ No stitches are needed. °· Ultrasound-guided core-needle biopsy. °¨ Ultrasound imaging helps guide the needle into the area of the breast that is not normal. °¨ A cut is made in the breast. The needle is put in the needle. °¨ Tissue samples are taken out. °· Open biopsy. °¨ A large cut is made in the breast. °¨ Your doctor will try to remove the whole breast lump or as  much as possible. °All tissue, fluid, or cell samples are looked at under a microscope.  °AFTER THE PROCEDURE °· You will be taken to an area to recover. You will be able to go home once you are doing well and are without problems. °· You may have bruising on your breast. This is normal. °· A pressure bandage (dressing) may be put on your breast for 24-8 hours. This type of bandage is wrapped tightly around your chest. It helps stop fluid from building up underneath tissues. °Document Released: 03/07/2012 Document Reviewed: 03/07/2012 °ExitCare® Patient Information ©2015 [employer]. This information is not intended to replace advice given to you by your health care provider. Make sure you discuss any questions you have with your health care provider. ° °Breast Biopsy °Care After °These instructions give you information on caring for yourself after your procedure. Your doctor may also give you more specific instructions. Call your doctor if you have any problems or questions after your procedure. °HOME CARE °· Only take medicine as told by your doctor. °· Do not take aspirin. °· Keep your sutures (stitches) dry when bathing. °· Protect the biopsy area. Do not let the area get bumped. °· Avoid activities that could pull the biopsy site open until your doctor approves. This includes: °¨ Stretching. °¨ Reaching. °¨ Exercise. °¨ Sports. °¨ Lifting more   than 3lb. °· Continue your normal diet. °· Wear a good support bra for as long as told by your doctor. °· Change any bandages (dressings) as told by your doctor. °· Do not drink alcohol while taking pain medicine. °· Keep all doctor visits as told. Ask when your test results will be ready. Make sure you get your test results. °GET HELP RIGHT AWAY IF:  °· You have a fever. °· You have more bleeding (more than a small spot) from the biopsy site. °· You have trouble breathing. °· You have yellowish-white fluid (pus) coming from the biopsy site. °· You have redness,  puffiness (swelling), or more pain in the biopsy site. °· You have a bad smell coming from the biopsy site. °· Your biopsy site opens after sutures, staples, or sticky strips have been removed. °· You have a rash. °· You need stronger medicine. °MAKE SURE YOU: °· Understand these instructions. °· Will watch your condition. °· Will get help right away if you are not doing well or get worse. °Document Released: 10/10/2009 Document Revised: 03/07/2012 Document Reviewed: 01/24/2012 °ExitCare® Patient Information ©2015 [employer]. This information is not intended to replace advice given to you by your health care provider. Make sure you discuss any questions you have with your health care provider. ° °

## 2014-07-21 IMAGING — US US BREAST*L*
1 series · 2 of 2 positions shown · non-contrast
Comparison: Prior examinations including 05/03/2013

CLINICAL DATA: Patient for followup of dense fibroglandular breast
tissue within the left breast 2 o'clock position.

EXAM:
DIGITAL DIAGNOSTIC  LEFT MAMMOGRAM WITH CAD
ULTRASOUND LEFT BREAST

[Series 1: us breast*left* · 0.09mm/px · 2 of 2 slices shown]
[im 1/2]
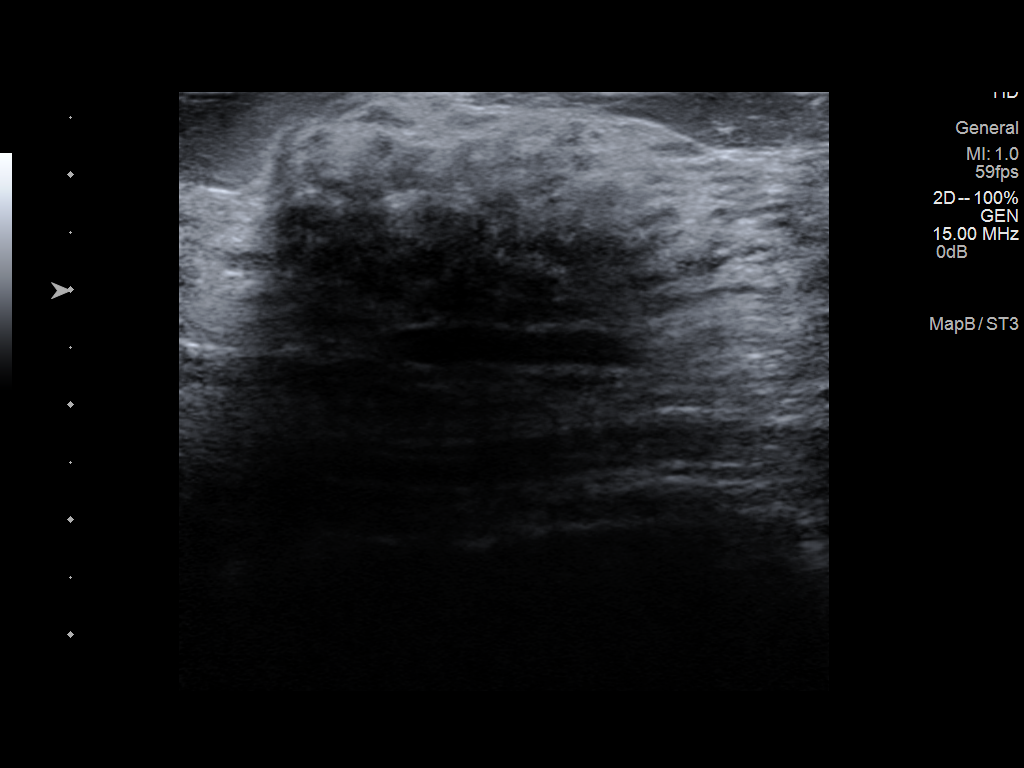
[im 2/2]
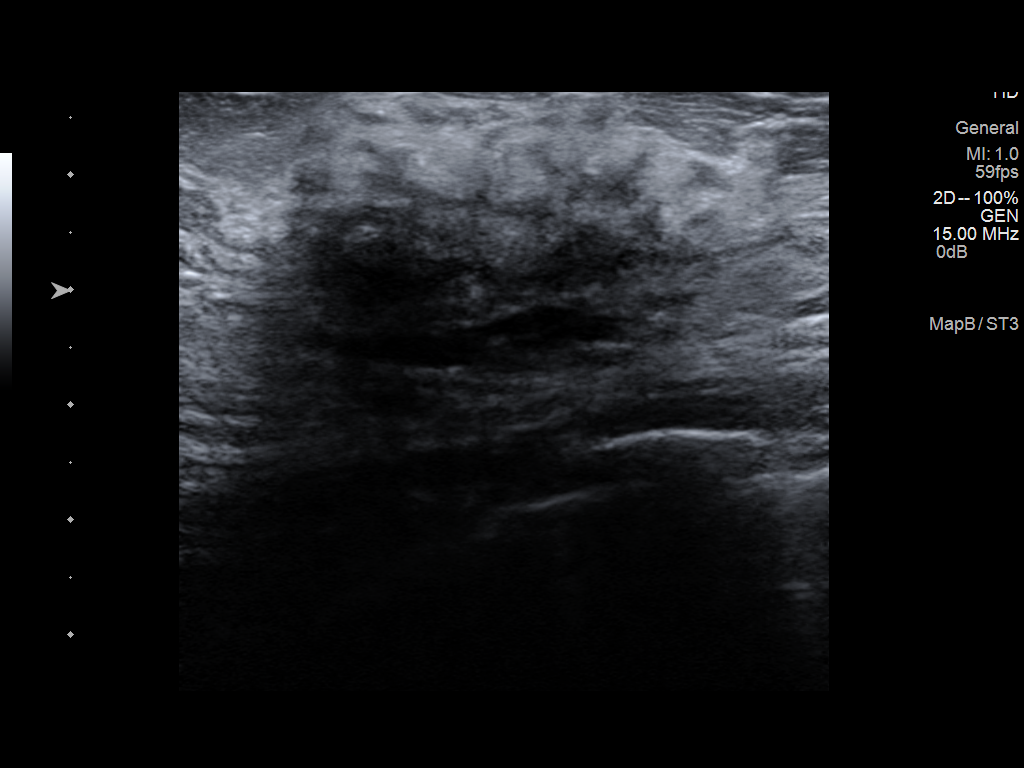

[2 of 2 positions shown; findings below may reference images not displayed]

ACR Breast Density Category d: The breasts are extremely dense,
which lowers the sensitivity of mammography.
FINDINGS: No concerning masses, calcifications or architectural distortion
identified. Re- demonstrated sebaceous cyst within the inferior
medial left breast. Previously evaluated area within the upper-outer
quadrant of the left breast at the site of prior palpable
abnormality is stable when compared to recent prior examinations.

Mammographic images were processed with CAD.

On physical exam,I palpate thickening within the upper-outer
quadrant of the left breast.

Ultrasound is performed, showing Stable dense fibroglandular breast
tissue within the left breast 2 o'clock position 3 cm from the
nipple at the site of palpable abnormality..
IMPRESSION: Stable probably benign dense fibroglandular tissue within the area
of palpable concern left breast 2 o'clock position.

RECOMMENDATION:
Bilateral diagnostic mammography and left breast ultrasound in 6
months to ensure stability.

I have discussed the findings and recommendations with the patient.
Results were also provided in writing at the conclusion of the
visit.

BI-RADS CATEGORY  3: Probably benign finding(s) - short interval
follow-up suggested.

## 2015-03-15 IMAGING — US US LT BREAST BX W LOC DEV 1ST LESION IMG BX SPEC US GUIDE
1 series · 13 of 14 positions shown · non-contrast
Comparison: Previous exams.

ADDENDUM:
Pathology revealed extensive stromal fibrosis in the left breast.
This was found to be concordant by Dr. Sumon Das Keya. Pathology
was discussed with the patient's mother, Rhma Karara, at the
request of the patient. The patient has done well after the biopsy.
Post biopsy instructions were reviewed and her questions were
answered. She was encouraged to call [REDACTED] for any additional concerns. The patient is asked to return
for annual screening mammography. My number is provided for future
questions and concerns.

Pathology results reported by Yumi Raymundo RN, BSN on July 06, 2014.
CLINICAL DATA: 53-year-old female with a palpable left breast
mass/area of shadowing at 1 o'clock.
EXAM:
ULTRASOUND GUIDED LEFT BREAST CORE NEEDLE BIOPSY

[Series 1: us left breast bx w loc dev 1st lesion img bx spec · 0.05mm/px · 13 of 14 slices shown]
[im 1/14]
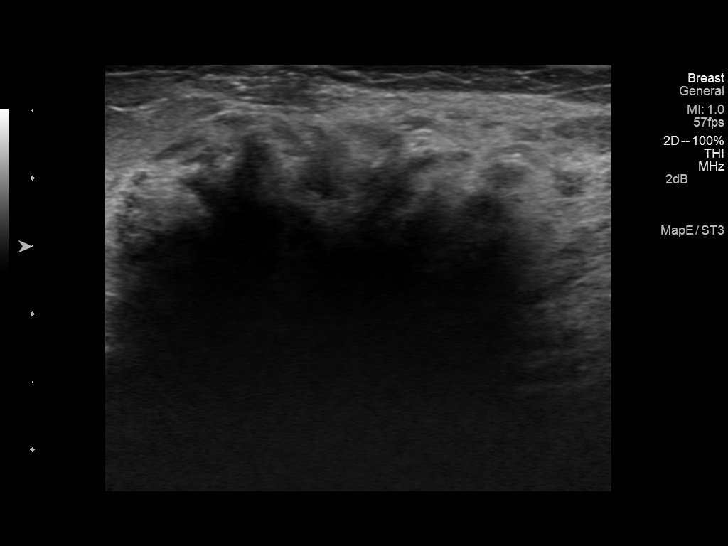
[im 2/14]
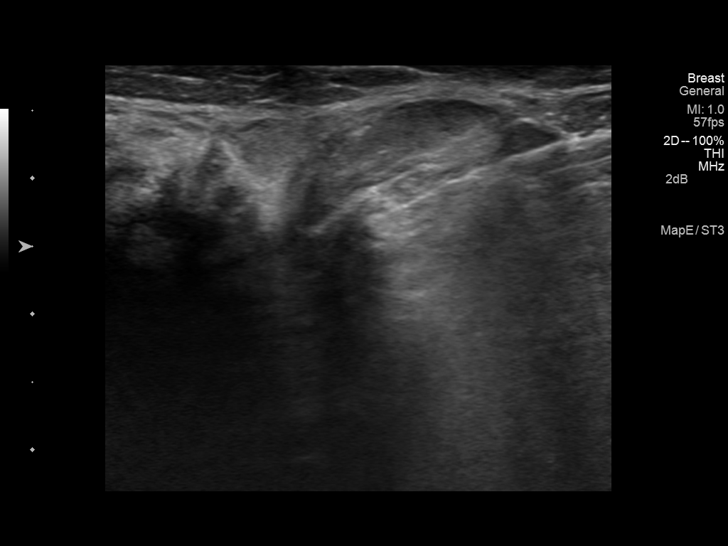
[im 3/14]
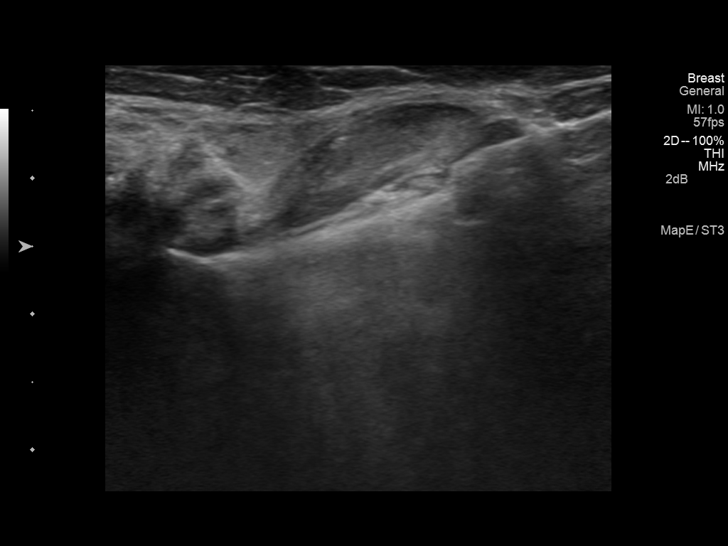
[im 4/14]
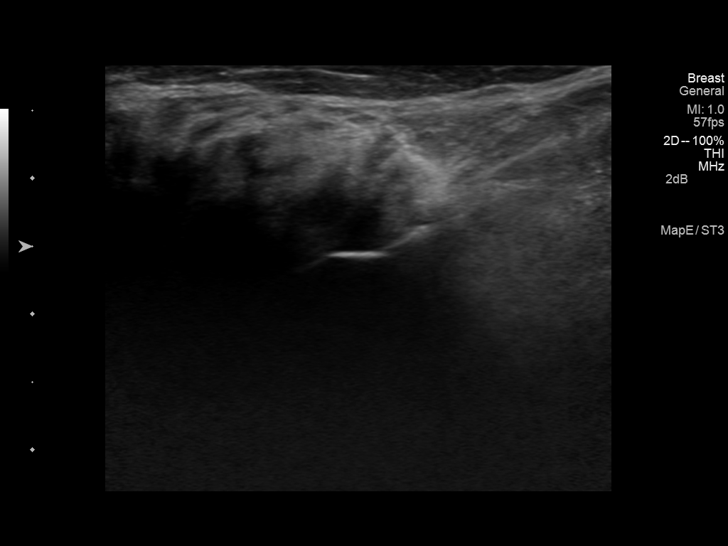
[im 5/14]
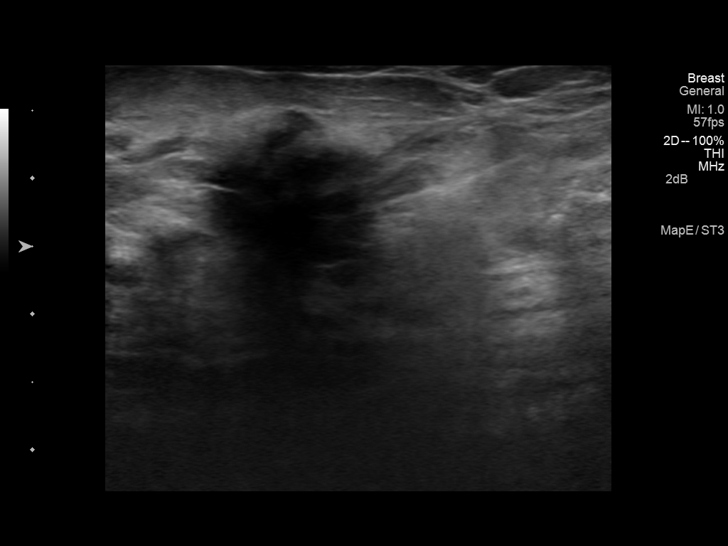
[im 6/14]
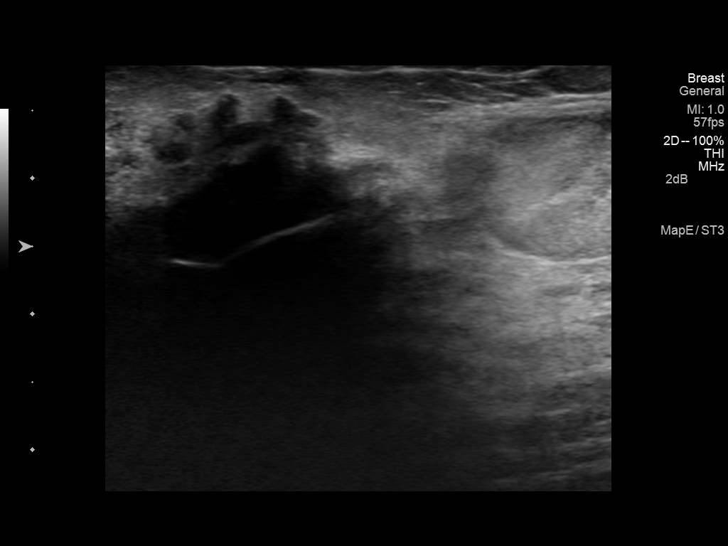
[im 8/14]
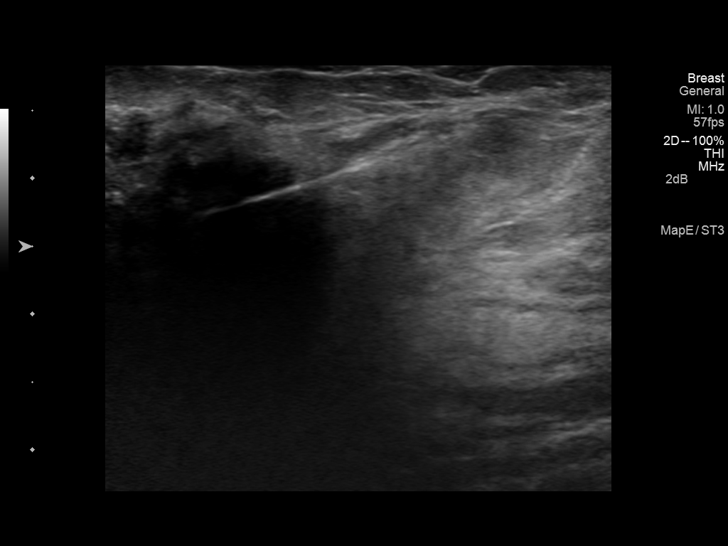
[im 9/14]
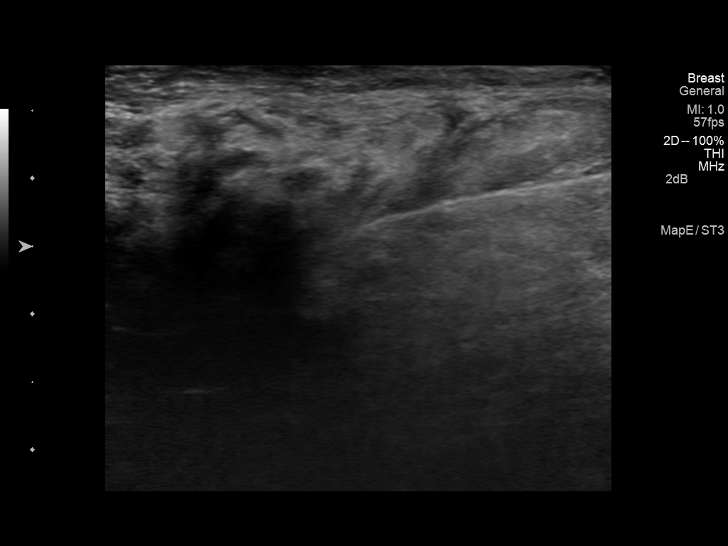
[im 10/14]
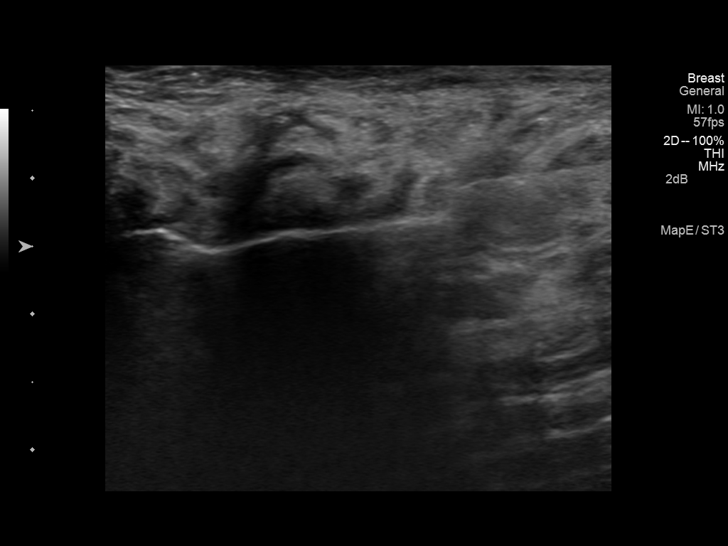
[im 11/14]
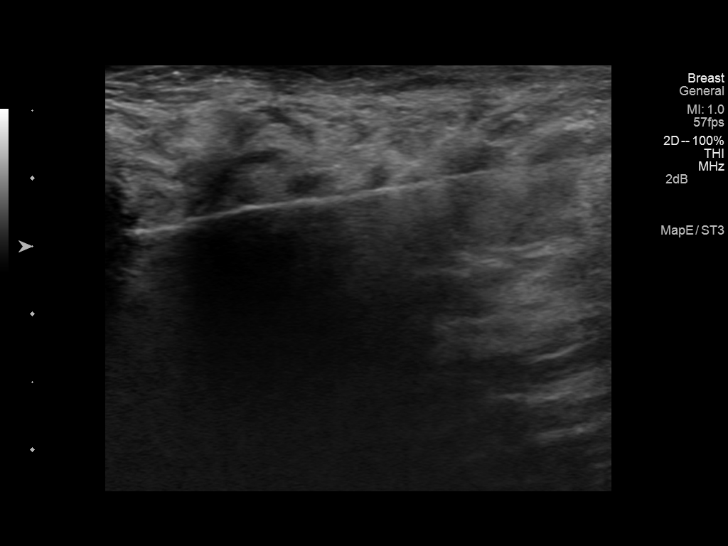
[im 12/14]
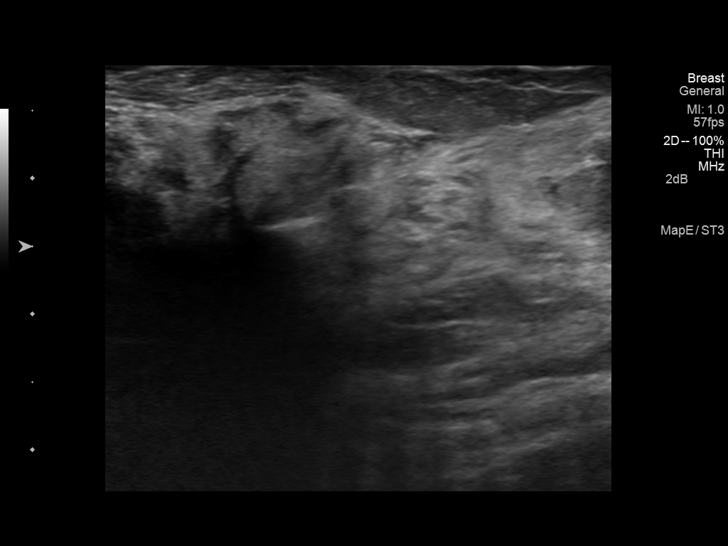
[im 13/14]
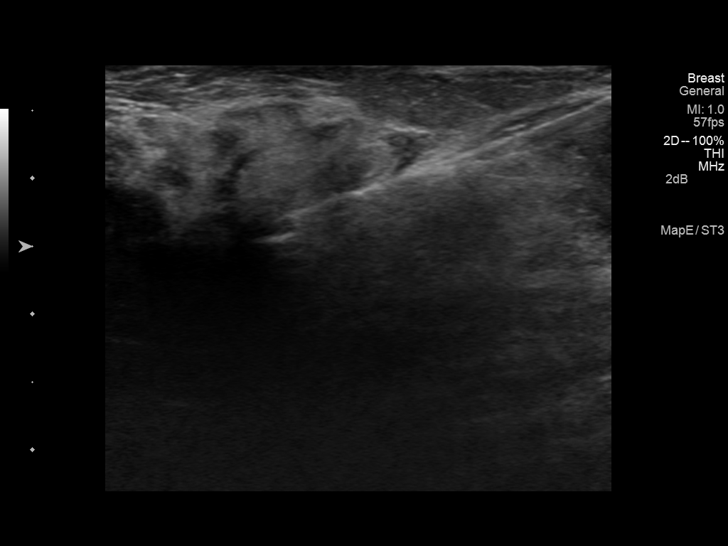
[im 14/14]
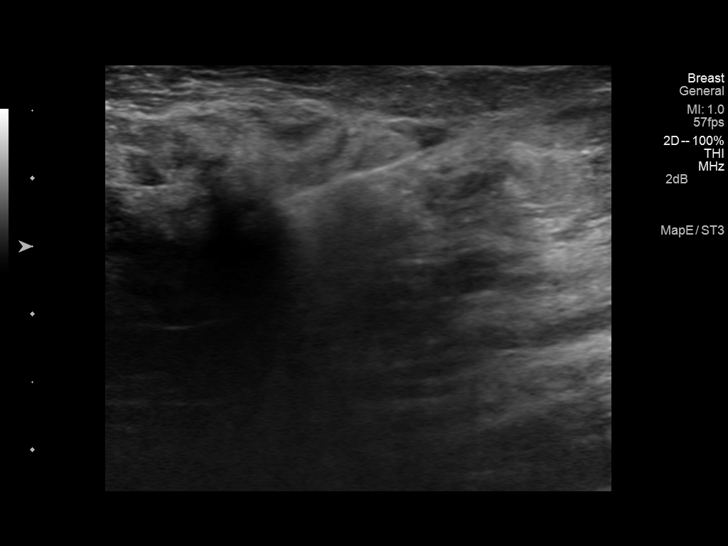

[13 of 14 positions shown; findings below may reference images not displayed]

PROCEDURE:
I met with the patient and we discussed the procedure of
ultrasound-guided biopsy, including benefits and alternatives. We
discussed the high likelihood of a successful procedure. We
discussed the risks of the procedure including infection, bleeding,
tissue injury, clip migration, and inadequate sampling. Informed
written consent was given. The usual time-out protocol was performed
immediately prior to the procedure.

Using sterile technique and 2% Lidocaine as local anesthetic, under
direct ultrasound visualization, a 12 gauge vacuum-assisteddevice
was used to perform biopsy of the shadowing area/mass in the
upper-outer left breast using a lateral approach. At the conclusion
of the procedure, a ribbon shaped tissue marker clip was deployed
into the biopsy cavity. Follow-up 2-view mammogram was performed and
dictated separately.
IMPRESSION: Ultrasound-guided biopsy of the shadowing area/ mass in the
upper-outer left breast. No apparent complications.

## 2015-08-02 ENCOUNTER — Other Ambulatory Visit (HOSPITAL_COMMUNITY): Payer: Self-pay | Admitting: Family Medicine

## 2015-08-02 DIAGNOSIS — Z1231 Encounter for screening mammogram for malignant neoplasm of breast: Secondary | ICD-10-CM

## 2015-08-26 ENCOUNTER — Ambulatory Visit (HOSPITAL_COMMUNITY)
Admission: RE | Admit: 2015-08-26 | Discharge: 2015-08-26 | Disposition: A | Discharge status: Home / Self Care | Payer: BLUE CROSS/BLUE SHIELD | Source: Ambulatory Visit | Attending: Family Medicine | Admitting: Family Medicine

## 2015-08-26 DIAGNOSIS — Z1231 Encounter for screening mammogram for malignant neoplasm of breast: Secondary | ICD-10-CM

## 2016-07-20 ENCOUNTER — Other Ambulatory Visit (HOSPITAL_COMMUNITY): Payer: Self-pay | Admitting: Family Medicine

## 2016-07-20 DIAGNOSIS — Z1231 Encounter for screening mammogram for malignant neoplasm of breast: Secondary | ICD-10-CM

## 2016-09-07 ENCOUNTER — Ambulatory Visit (HOSPITAL_COMMUNITY)
Admission: RE | Admit: 2016-09-07 | Discharge: 2016-09-07 | Disposition: A | Discharge status: Home / Self Care | Payer: BLUE CROSS/BLUE SHIELD | Source: Ambulatory Visit | Attending: Family Medicine | Admitting: Family Medicine

## 2016-09-07 DIAGNOSIS — E1129 Type 2 diabetes mellitus with other diabetic kidney complication: Secondary | ICD-10-CM | POA: Diagnosis not present

## 2016-09-07 DIAGNOSIS — Z1231 Encounter for screening mammogram for malignant neoplasm of breast: Secondary | ICD-10-CM | POA: Insufficient documentation

## 2016-09-07 DIAGNOSIS — Z6827 Body mass index (BMI) 27.0-27.9, adult: Secondary | ICD-10-CM | POA: Diagnosis not present

## 2016-09-07 DIAGNOSIS — Z1389 Encounter for screening for other disorder: Secondary | ICD-10-CM | POA: Diagnosis not present

## 2016-09-07 DIAGNOSIS — I10 Essential (primary) hypertension: Secondary | ICD-10-CM | POA: Diagnosis not present

## 2016-09-07 DIAGNOSIS — E782 Mixed hyperlipidemia: Secondary | ICD-10-CM | POA: Diagnosis not present

## 2016-09-07 DIAGNOSIS — E119 Type 2 diabetes mellitus without complications: Secondary | ICD-10-CM | POA: Diagnosis not present

## 2017-06-21 DIAGNOSIS — E11610 Type 2 diabetes mellitus with diabetic neuropathic arthropathy: Secondary | ICD-10-CM | POA: Diagnosis not present

## 2017-06-21 DIAGNOSIS — M79671 Pain in right foot: Secondary | ICD-10-CM | POA: Diagnosis not present

## 2017-07-23 DIAGNOSIS — I10 Essential (primary) hypertension: Secondary | ICD-10-CM | POA: Insufficient documentation

## 2017-07-23 DIAGNOSIS — M14671 Charcot's joint, right ankle and foot: Secondary | ICD-10-CM | POA: Diagnosis not present

## 2017-07-23 DIAGNOSIS — M84474A Pathological fracture, right foot, initial encounter for fracture: Secondary | ICD-10-CM | POA: Diagnosis not present

## 2017-07-23 DIAGNOSIS — E119 Type 2 diabetes mellitus without complications: Secondary | ICD-10-CM | POA: Insufficient documentation

## 2017-07-23 DIAGNOSIS — G8918 Other acute postprocedural pain: Secondary | ICD-10-CM | POA: Diagnosis not present

## 2017-07-23 DIAGNOSIS — R509 Fever, unspecified: Secondary | ICD-10-CM | POA: Diagnosis not present

## 2017-07-23 DIAGNOSIS — E11610 Type 2 diabetes mellitus with diabetic neuropathic arthropathy: Secondary | ICD-10-CM | POA: Insufficient documentation

## 2017-07-23 DIAGNOSIS — E11618 Type 2 diabetes mellitus with other diabetic arthropathy: Secondary | ICD-10-CM | POA: Diagnosis not present

## 2017-07-23 DIAGNOSIS — D62 Acute posthemorrhagic anemia: Secondary | ICD-10-CM | POA: Diagnosis not present

## 2017-07-23 DIAGNOSIS — N179 Acute kidney failure, unspecified: Secondary | ICD-10-CM | POA: Diagnosis not present

## 2017-07-23 DIAGNOSIS — Z87891 Personal history of nicotine dependence: Secondary | ICD-10-CM | POA: Diagnosis not present

## 2017-07-23 DIAGNOSIS — Z7984 Long term (current) use of oral hypoglycemic drugs: Secondary | ICD-10-CM | POA: Diagnosis not present

## 2017-07-23 DIAGNOSIS — D649 Anemia, unspecified: Secondary | ICD-10-CM | POA: Diagnosis not present

## 2017-07-23 DIAGNOSIS — A5216 Charcot's arthropathy (tabetic): Secondary | ICD-10-CM | POA: Diagnosis not present

## 2017-07-24 DIAGNOSIS — G8918 Other acute postprocedural pain: Secondary | ICD-10-CM | POA: Diagnosis not present

## 2017-07-24 DIAGNOSIS — E11610 Type 2 diabetes mellitus with diabetic neuropathic arthropathy: Secondary | ICD-10-CM | POA: Diagnosis not present

## 2017-07-24 DIAGNOSIS — E11618 Type 2 diabetes mellitus with other diabetic arthropathy: Secondary | ICD-10-CM | POA: Diagnosis not present

## 2017-07-25 DIAGNOSIS — G8918 Other acute postprocedural pain: Secondary | ICD-10-CM | POA: Diagnosis not present

## 2017-07-25 DIAGNOSIS — E11610 Type 2 diabetes mellitus with diabetic neuropathic arthropathy: Secondary | ICD-10-CM | POA: Diagnosis not present

## 2017-07-25 DIAGNOSIS — E11618 Type 2 diabetes mellitus with other diabetic arthropathy: Secondary | ICD-10-CM | POA: Diagnosis not present

## 2017-07-26 DIAGNOSIS — G8918 Other acute postprocedural pain: Secondary | ICD-10-CM | POA: Diagnosis not present

## 2017-07-26 DIAGNOSIS — E11618 Type 2 diabetes mellitus with other diabetic arthropathy: Secondary | ICD-10-CM | POA: Diagnosis not present

## 2017-07-26 DIAGNOSIS — M84474A Pathological fracture, right foot, initial encounter for fracture: Secondary | ICD-10-CM | POA: Diagnosis not present

## 2017-07-26 DIAGNOSIS — D649 Anemia, unspecified: Secondary | ICD-10-CM | POA: Diagnosis not present

## 2017-07-26 DIAGNOSIS — E11610 Type 2 diabetes mellitus with diabetic neuropathic arthropathy: Secondary | ICD-10-CM | POA: Diagnosis not present

## 2017-07-26 DIAGNOSIS — R509 Fever, unspecified: Secondary | ICD-10-CM | POA: Diagnosis not present

## 2017-07-27 DIAGNOSIS — D649 Anemia, unspecified: Secondary | ICD-10-CM | POA: Diagnosis not present

## 2017-07-27 DIAGNOSIS — I10 Essential (primary) hypertension: Secondary | ICD-10-CM | POA: Diagnosis not present

## 2017-07-27 DIAGNOSIS — Z79891 Long term (current) use of opiate analgesic: Secondary | ICD-10-CM | POA: Diagnosis not present

## 2017-07-27 DIAGNOSIS — Z7984 Long term (current) use of oral hypoglycemic drugs: Secondary | ICD-10-CM | POA: Diagnosis not present

## 2017-07-27 DIAGNOSIS — E11610 Type 2 diabetes mellitus with diabetic neuropathic arthropathy: Secondary | ICD-10-CM | POA: Diagnosis not present

## 2017-07-28 DIAGNOSIS — E11610 Type 2 diabetes mellitus with diabetic neuropathic arthropathy: Secondary | ICD-10-CM | POA: Diagnosis not present

## 2017-07-28 DIAGNOSIS — Z79891 Long term (current) use of opiate analgesic: Secondary | ICD-10-CM | POA: Diagnosis not present

## 2017-07-28 DIAGNOSIS — I10 Essential (primary) hypertension: Secondary | ICD-10-CM | POA: Diagnosis not present

## 2017-07-28 DIAGNOSIS — Z7984 Long term (current) use of oral hypoglycemic drugs: Secondary | ICD-10-CM | POA: Diagnosis not present

## 2017-07-28 DIAGNOSIS — D649 Anemia, unspecified: Secondary | ICD-10-CM | POA: Diagnosis not present

## 2017-07-29 DIAGNOSIS — Z79891 Long term (current) use of opiate analgesic: Secondary | ICD-10-CM | POA: Diagnosis not present

## 2017-07-29 DIAGNOSIS — Z7984 Long term (current) use of oral hypoglycemic drugs: Secondary | ICD-10-CM | POA: Diagnosis not present

## 2017-07-29 DIAGNOSIS — D649 Anemia, unspecified: Secondary | ICD-10-CM | POA: Diagnosis not present

## 2017-07-29 DIAGNOSIS — E11610 Type 2 diabetes mellitus with diabetic neuropathic arthropathy: Secondary | ICD-10-CM | POA: Diagnosis not present

## 2017-07-29 DIAGNOSIS — I10 Essential (primary) hypertension: Secondary | ICD-10-CM | POA: Diagnosis not present

## 2017-07-30 DIAGNOSIS — I10 Essential (primary) hypertension: Secondary | ICD-10-CM | POA: Diagnosis not present

## 2017-07-30 DIAGNOSIS — D649 Anemia, unspecified: Secondary | ICD-10-CM | POA: Diagnosis not present

## 2017-07-30 DIAGNOSIS — Z79891 Long term (current) use of opiate analgesic: Secondary | ICD-10-CM | POA: Diagnosis not present

## 2017-07-30 DIAGNOSIS — E11610 Type 2 diabetes mellitus with diabetic neuropathic arthropathy: Secondary | ICD-10-CM | POA: Diagnosis not present

## 2017-07-30 DIAGNOSIS — Z7984 Long term (current) use of oral hypoglycemic drugs: Secondary | ICD-10-CM | POA: Diagnosis not present

## 2017-08-02 DIAGNOSIS — Z79891 Long term (current) use of opiate analgesic: Secondary | ICD-10-CM | POA: Diagnosis not present

## 2017-08-02 DIAGNOSIS — Z7984 Long term (current) use of oral hypoglycemic drugs: Secondary | ICD-10-CM | POA: Diagnosis not present

## 2017-08-02 DIAGNOSIS — D649 Anemia, unspecified: Secondary | ICD-10-CM | POA: Diagnosis not present

## 2017-08-02 DIAGNOSIS — I10 Essential (primary) hypertension: Secondary | ICD-10-CM | POA: Diagnosis not present

## 2017-08-02 DIAGNOSIS — E11610 Type 2 diabetes mellitus with diabetic neuropathic arthropathy: Secondary | ICD-10-CM | POA: Diagnosis not present

## 2017-08-03 DIAGNOSIS — Z7984 Long term (current) use of oral hypoglycemic drugs: Secondary | ICD-10-CM | POA: Diagnosis not present

## 2017-08-03 DIAGNOSIS — D649 Anemia, unspecified: Secondary | ICD-10-CM | POA: Diagnosis not present

## 2017-08-03 DIAGNOSIS — I10 Essential (primary) hypertension: Secondary | ICD-10-CM | POA: Diagnosis not present

## 2017-08-03 DIAGNOSIS — E11610 Type 2 diabetes mellitus with diabetic neuropathic arthropathy: Secondary | ICD-10-CM | POA: Diagnosis not present

## 2017-08-03 DIAGNOSIS — Z79891 Long term (current) use of opiate analgesic: Secondary | ICD-10-CM | POA: Diagnosis not present

## 2017-08-05 DIAGNOSIS — E11610 Type 2 diabetes mellitus with diabetic neuropathic arthropathy: Secondary | ICD-10-CM | POA: Diagnosis not present

## 2017-08-05 DIAGNOSIS — I10 Essential (primary) hypertension: Secondary | ICD-10-CM | POA: Diagnosis not present

## 2017-08-05 DIAGNOSIS — Z79891 Long term (current) use of opiate analgesic: Secondary | ICD-10-CM | POA: Diagnosis not present

## 2017-08-05 DIAGNOSIS — D649 Anemia, unspecified: Secondary | ICD-10-CM | POA: Diagnosis not present

## 2017-08-05 DIAGNOSIS — Z7984 Long term (current) use of oral hypoglycemic drugs: Secondary | ICD-10-CM | POA: Diagnosis not present

## 2017-08-06 DIAGNOSIS — E11610 Type 2 diabetes mellitus with diabetic neuropathic arthropathy: Secondary | ICD-10-CM | POA: Diagnosis not present

## 2017-08-06 DIAGNOSIS — Z7984 Long term (current) use of oral hypoglycemic drugs: Secondary | ICD-10-CM | POA: Diagnosis not present

## 2017-08-06 DIAGNOSIS — Z79891 Long term (current) use of opiate analgesic: Secondary | ICD-10-CM | POA: Diagnosis not present

## 2017-08-06 DIAGNOSIS — I10 Essential (primary) hypertension: Secondary | ICD-10-CM | POA: Diagnosis not present

## 2017-08-06 DIAGNOSIS — D649 Anemia, unspecified: Secondary | ICD-10-CM | POA: Diagnosis not present

## 2017-08-10 DIAGNOSIS — I10 Essential (primary) hypertension: Secondary | ICD-10-CM | POA: Diagnosis not present

## 2017-08-10 DIAGNOSIS — D649 Anemia, unspecified: Secondary | ICD-10-CM | POA: Diagnosis not present

## 2017-08-10 DIAGNOSIS — Z7984 Long term (current) use of oral hypoglycemic drugs: Secondary | ICD-10-CM | POA: Diagnosis not present

## 2017-08-10 DIAGNOSIS — Z79891 Long term (current) use of opiate analgesic: Secondary | ICD-10-CM | POA: Diagnosis not present

## 2017-08-10 DIAGNOSIS — E11610 Type 2 diabetes mellitus with diabetic neuropathic arthropathy: Secondary | ICD-10-CM | POA: Diagnosis not present

## 2017-08-12 DIAGNOSIS — E11610 Type 2 diabetes mellitus with diabetic neuropathic arthropathy: Secondary | ICD-10-CM | POA: Diagnosis not present

## 2017-08-12 DIAGNOSIS — Z7984 Long term (current) use of oral hypoglycemic drugs: Secondary | ICD-10-CM | POA: Diagnosis not present

## 2017-08-12 DIAGNOSIS — D649 Anemia, unspecified: Secondary | ICD-10-CM | POA: Diagnosis not present

## 2017-08-12 DIAGNOSIS — I10 Essential (primary) hypertension: Secondary | ICD-10-CM | POA: Diagnosis not present

## 2017-08-12 DIAGNOSIS — Z79891 Long term (current) use of opiate analgesic: Secondary | ICD-10-CM | POA: Diagnosis not present

## 2017-08-24 DIAGNOSIS — Z4789 Encounter for other orthopedic aftercare: Secondary | ICD-10-CM | POA: Diagnosis not present

## 2017-08-26 DIAGNOSIS — M84474A Pathological fracture, right foot, initial encounter for fracture: Secondary | ICD-10-CM | POA: Diagnosis not present

## 2017-09-02 DIAGNOSIS — Z681 Body mass index (BMI) 19 or less, adult: Secondary | ICD-10-CM | POA: Diagnosis not present

## 2017-09-02 DIAGNOSIS — I10 Essential (primary) hypertension: Secondary | ICD-10-CM | POA: Diagnosis not present

## 2017-09-02 DIAGNOSIS — E1165 Type 2 diabetes mellitus with hyperglycemia: Secondary | ICD-10-CM | POA: Diagnosis not present

## 2017-09-02 DIAGNOSIS — E782 Mixed hyperlipidemia: Secondary | ICD-10-CM | POA: Diagnosis not present

## 2017-09-02 DIAGNOSIS — Z1389 Encounter for screening for other disorder: Secondary | ICD-10-CM | POA: Diagnosis not present

## 2017-09-07 DIAGNOSIS — Z4789 Encounter for other orthopedic aftercare: Secondary | ICD-10-CM | POA: Diagnosis not present

## 2017-09-26 DIAGNOSIS — M84474A Pathological fracture, right foot, initial encounter for fracture: Secondary | ICD-10-CM | POA: Diagnosis not present

## 2017-09-28 DIAGNOSIS — M79671 Pain in right foot: Secondary | ICD-10-CM | POA: Diagnosis not present

## 2017-09-28 DIAGNOSIS — Z4789 Encounter for other orthopedic aftercare: Secondary | ICD-10-CM | POA: Diagnosis not present

## 2017-09-28 DIAGNOSIS — M14671 Charcot's joint, right ankle and foot: Secondary | ICD-10-CM | POA: Diagnosis not present

## 2017-10-13 DIAGNOSIS — Z79899 Other long term (current) drug therapy: Secondary | ICD-10-CM | POA: Diagnosis not present

## 2017-10-13 DIAGNOSIS — M79671 Pain in right foot: Secondary | ICD-10-CM | POA: Diagnosis not present

## 2017-10-13 DIAGNOSIS — Z7984 Long term (current) use of oral hypoglycemic drugs: Secondary | ICD-10-CM | POA: Diagnosis not present

## 2017-10-13 DIAGNOSIS — E119 Type 2 diabetes mellitus without complications: Secondary | ICD-10-CM | POA: Diagnosis not present

## 2017-10-13 DIAGNOSIS — I10 Essential (primary) hypertension: Secondary | ICD-10-CM | POA: Diagnosis not present

## 2017-10-13 DIAGNOSIS — A5216 Charcot's arthropathy (tabetic): Secondary | ICD-10-CM | POA: Diagnosis not present

## 2017-10-13 DIAGNOSIS — Z4789 Encounter for other orthopedic aftercare: Secondary | ICD-10-CM | POA: Diagnosis not present

## 2017-10-13 DIAGNOSIS — Z472 Encounter for removal of internal fixation device: Secondary | ICD-10-CM | POA: Diagnosis not present

## 2017-10-27 DIAGNOSIS — Z4789 Encounter for other orthopedic aftercare: Secondary | ICD-10-CM | POA: Diagnosis not present

## 2017-10-27 DIAGNOSIS — E11618 Type 2 diabetes mellitus with other diabetic arthropathy: Secondary | ICD-10-CM | POA: Diagnosis not present

## 2017-10-27 DIAGNOSIS — E11610 Type 2 diabetes mellitus with diabetic neuropathic arthropathy: Secondary | ICD-10-CM | POA: Diagnosis not present

## 2017-10-27 DIAGNOSIS — M84474A Pathological fracture, right foot, initial encounter for fracture: Secondary | ICD-10-CM | POA: Diagnosis not present

## 2017-11-09 DIAGNOSIS — E11610 Type 2 diabetes mellitus with diabetic neuropathic arthropathy: Secondary | ICD-10-CM | POA: Diagnosis not present

## 2017-11-09 DIAGNOSIS — Z4789 Encounter for other orthopedic aftercare: Secondary | ICD-10-CM | POA: Diagnosis not present

## 2017-11-16 DIAGNOSIS — M14671 Charcot's joint, right ankle and foot: Secondary | ICD-10-CM | POA: Diagnosis not present

## 2017-11-24 DIAGNOSIS — E11610 Type 2 diabetes mellitus with diabetic neuropathic arthropathy: Secondary | ICD-10-CM | POA: Diagnosis not present

## 2017-11-26 DIAGNOSIS — M84474A Pathological fracture, right foot, initial encounter for fracture: Secondary | ICD-10-CM | POA: Diagnosis not present

## 2017-12-14 DIAGNOSIS — E11610 Type 2 diabetes mellitus with diabetic neuropathic arthropathy: Secondary | ICD-10-CM | POA: Diagnosis not present

## 2017-12-14 DIAGNOSIS — Z4789 Encounter for other orthopedic aftercare: Secondary | ICD-10-CM | POA: Diagnosis not present

## 2017-12-27 DIAGNOSIS — M84474A Pathological fracture, right foot, initial encounter for fracture: Secondary | ICD-10-CM | POA: Diagnosis not present

## 2018-01-20 DIAGNOSIS — E11610 Type 2 diabetes mellitus with diabetic neuropathic arthropathy: Secondary | ICD-10-CM | POA: Diagnosis not present

## 2018-01-20 DIAGNOSIS — Z4789 Encounter for other orthopedic aftercare: Secondary | ICD-10-CM | POA: Diagnosis not present

## 2018-01-25 ENCOUNTER — Other Ambulatory Visit (HOSPITAL_COMMUNITY): Payer: Self-pay | Admitting: Family Medicine

## 2018-01-25 DIAGNOSIS — Z1231 Encounter for screening mammogram for malignant neoplasm of breast: Secondary | ICD-10-CM

## 2018-01-27 DIAGNOSIS — M84474A Pathological fracture, right foot, initial encounter for fracture: Secondary | ICD-10-CM | POA: Diagnosis not present

## 2018-01-31 ENCOUNTER — Ambulatory Visit (HOSPITAL_COMMUNITY)
Admission: RE | Admit: 2018-01-31 | Discharge: 2018-01-31 | Disposition: A | Discharge status: Home / Self Care | Payer: BLUE CROSS/BLUE SHIELD | Source: Ambulatory Visit | Attending: Family Medicine | Admitting: Family Medicine

## 2018-01-31 ENCOUNTER — Encounter (HOSPITAL_COMMUNITY): Payer: Self-pay

## 2018-01-31 DIAGNOSIS — Z1231 Encounter for screening mammogram for malignant neoplasm of breast: Secondary | ICD-10-CM | POA: Diagnosis not present

## 2018-02-04 DIAGNOSIS — M25374 Other instability, right foot: Secondary | ICD-10-CM | POA: Diagnosis not present

## 2018-02-04 DIAGNOSIS — M25371 Other instability, right ankle: Secondary | ICD-10-CM | POA: Diagnosis not present

## 2018-02-04 DIAGNOSIS — M14671 Charcot's joint, right ankle and foot: Secondary | ICD-10-CM | POA: Diagnosis not present

## 2018-02-24 DIAGNOSIS — M84474A Pathological fracture, right foot, initial encounter for fracture: Secondary | ICD-10-CM | POA: Diagnosis not present

## 2018-04-08 DIAGNOSIS — Z6824 Body mass index (BMI) 24.0-24.9, adult: Secondary | ICD-10-CM | POA: Diagnosis not present

## 2018-04-08 DIAGNOSIS — Z1389 Encounter for screening for other disorder: Secondary | ICD-10-CM | POA: Diagnosis not present

## 2018-04-08 DIAGNOSIS — E1165 Type 2 diabetes mellitus with hyperglycemia: Secondary | ICD-10-CM | POA: Diagnosis not present

## 2018-04-08 DIAGNOSIS — E7849 Other hyperlipidemia: Secondary | ICD-10-CM | POA: Diagnosis not present

## 2018-04-08 DIAGNOSIS — G600 Hereditary motor and sensory neuropathy: Secondary | ICD-10-CM | POA: Diagnosis not present

## 2018-04-08 DIAGNOSIS — I10 Essential (primary) hypertension: Secondary | ICD-10-CM | POA: Diagnosis not present

## 2018-04-08 DIAGNOSIS — Z23 Encounter for immunization: Secondary | ICD-10-CM | POA: Diagnosis not present

## 2018-04-08 DIAGNOSIS — E1129 Type 2 diabetes mellitus with other diabetic kidney complication: Secondary | ICD-10-CM | POA: Diagnosis not present

## 2018-11-18 DIAGNOSIS — E663 Overweight: Secondary | ICD-10-CM | POA: Diagnosis not present

## 2018-11-18 DIAGNOSIS — I10 Essential (primary) hypertension: Secondary | ICD-10-CM | POA: Diagnosis not present

## 2018-11-18 DIAGNOSIS — Z1389 Encounter for screening for other disorder: Secondary | ICD-10-CM | POA: Diagnosis not present

## 2018-11-18 DIAGNOSIS — E782 Mixed hyperlipidemia: Secondary | ICD-10-CM | POA: Diagnosis not present

## 2018-11-18 DIAGNOSIS — E1165 Type 2 diabetes mellitus with hyperglycemia: Secondary | ICD-10-CM | POA: Diagnosis not present

## 2018-11-18 DIAGNOSIS — Z6826 Body mass index (BMI) 26.0-26.9, adult: Secondary | ICD-10-CM | POA: Diagnosis not present

## 2019-11-10 DIAGNOSIS — Z6824 Body mass index (BMI) 24.0-24.9, adult: Secondary | ICD-10-CM | POA: Diagnosis not present

## 2019-11-10 DIAGNOSIS — E1129 Type 2 diabetes mellitus with other diabetic kidney complication: Secondary | ICD-10-CM | POA: Diagnosis not present

## 2019-11-10 DIAGNOSIS — E7849 Other hyperlipidemia: Secondary | ICD-10-CM | POA: Diagnosis not present

## 2019-11-10 DIAGNOSIS — Z0001 Encounter for general adult medical examination with abnormal findings: Secondary | ICD-10-CM | POA: Diagnosis not present

## 2019-11-10 DIAGNOSIS — G600 Hereditary motor and sensory neuropathy: Secondary | ICD-10-CM | POA: Diagnosis not present

## 2019-11-10 DIAGNOSIS — I10 Essential (primary) hypertension: Secondary | ICD-10-CM | POA: Diagnosis not present

## 2019-11-10 DIAGNOSIS — Z1389 Encounter for screening for other disorder: Secondary | ICD-10-CM | POA: Diagnosis not present

## 2019-11-10 DIAGNOSIS — E1165 Type 2 diabetes mellitus with hyperglycemia: Secondary | ICD-10-CM | POA: Diagnosis not present

## 2019-11-10 DIAGNOSIS — N183 Chronic kidney disease, stage 3 unspecified: Secondary | ICD-10-CM | POA: Diagnosis not present

## 2019-11-13 DIAGNOSIS — Z1389 Encounter for screening for other disorder: Secondary | ICD-10-CM | POA: Diagnosis not present

## 2019-11-13 DIAGNOSIS — Z6824 Body mass index (BMI) 24.0-24.9, adult: Secondary | ICD-10-CM | POA: Diagnosis not present

## 2019-11-13 DIAGNOSIS — E1129 Type 2 diabetes mellitus with other diabetic kidney complication: Secondary | ICD-10-CM | POA: Diagnosis not present

## 2019-11-13 DIAGNOSIS — E1165 Type 2 diabetes mellitus with hyperglycemia: Secondary | ICD-10-CM | POA: Diagnosis not present

## 2020-04-04 DIAGNOSIS — Z23 Encounter for immunization: Secondary | ICD-10-CM | POA: Diagnosis not present

## 2020-04-26 DIAGNOSIS — Z23 Encounter for immunization: Secondary | ICD-10-CM | POA: Diagnosis not present

## 2020-08-15 ENCOUNTER — Other Ambulatory Visit: Payer: Self-pay

## 2020-08-15 ENCOUNTER — Ambulatory Visit: Payer: BC Managed Care – PPO

## 2020-08-15 ENCOUNTER — Encounter: Payer: Self-pay | Admitting: Orthopedic Surgery

## 2020-08-15 ENCOUNTER — Ambulatory Visit (INDEPENDENT_AMBULATORY_CARE_PROVIDER_SITE_OTHER): Payer: BC Managed Care – PPO | Admitting: Orthopedic Surgery

## 2020-08-15 VITALS — BP 184/80 | HR 88 | Ht 67.0 in | Wt 160.0 lb

## 2020-08-15 DIAGNOSIS — M25511 Pain in right shoulder: Secondary | ICD-10-CM

## 2020-08-15 DIAGNOSIS — M7551 Bursitis of right shoulder: Secondary | ICD-10-CM

## 2020-08-15 DIAGNOSIS — G8929 Other chronic pain: Secondary | ICD-10-CM

## 2020-08-15 MED ORDER — MELOXICAM 7.5 MG PO TABS: 7.5000 mg | ORAL_TABLET | Freq: Every day | ORAL | 5 refills | Status: DC

## 2020-08-15 MED ORDER — OMEPRAZOLE 20 MG PO CPDR: 20.0000 mg | DELAYED_RELEASE_CAPSULE | Freq: Every day | ORAL | 0 refills | Status: DC

## 2020-08-15 NOTE — Patient Instructions (Signed)
You have received an injection of steroids into the joint. 15% of patients will have increased pain within the 24 hours postinjection.   This is transient and will go away.   We recommend that you use ice packs on the injection site for 20 minutes every 2 hours and extra strength Tylenol 2 tablets every 8 as needed until the pain resolves.  If you continue to have pain after taking the Tylenol and using the ice please call the office for further instructions.  OOW X 7 DAYS   REST THE SHOULDER   TAKE THE MEDICATIONS AS DIRECTED

## 2020-08-15 NOTE — Progress Notes (Addendum)
Patient ID: Morgan [name], female   DOB: 1961-08-25, 59 y.o.   MRN: [id number]  Chief Complaint  Patient presents with  . Shoulder Pain    right     HPI Morgan [name] is a 59 y.o. female.  59 year old female with right shoulder pain.  Works at [employer] does a lot of heavy lifting complains of pain right shoulder x1-1/2 months no trauma no prior medications or treatment notices decreased range of motion pain at night pain sleeping on the right shoulder  Review of Systems Review of Systems  Neurological: Negative.   All other systems reviewed and are negative.   Past Medical History:  Diagnosis Date  . Cataract   . Diabetes mellitus without complication (HCC)   . Disorder of vitamin B12 04/17/2013   Elevated while taking 1000 mcg of Vitamin B12 PO  . Hyperlipidemia   . Hypertension     Past Surgical History:  Procedure Laterality Date  . BILATERAL KNEE ARTHROSCOPY Bilateral   . BREAST BIOPSY Left    Benign  . CATARACT EXTRACTION W/ INTRAOCULAR LENS IMPLANT Bilateral     Family History  Problem Relation Age of Onset  . Stroke Father   . Diabetes Mother   . Hypertension Mother      Social History   Tobacco Use  . Smoking status: Former Smoker    Packs/day: 1.00    Years: 15.00    Pack years: 15.00    Types: Cigarettes    Quit date: 04/17/2008    Years since quitting: 12.3  . Smokeless tobacco: Never Used  Substance Use Topics  . Alcohol use: No  . Drug use: No    Allergies  Allergen Reactions  . Aspirin     Makes stomach hurt badly    Current Meds  Medication Sig  . atorvastatin (LIPITOR) 20 MG tablet Take 20 mg by mouth daily.  Marland Kitchen lisinopril (ZESTRIL) 20 MG tablet Take 20 mg by mouth daily.  . metFORMIN (GLUCOPHAGE) 500 MG tablet SMARTSIG:1 Tablet(s) By Mouth Morning-Evening  . [DISCONTINUED] pravastatin (PRAVACHOL) 20 MG tablet Take 1 tablet by mouth daily.     Physical Exam BP (!) 184/80   Pulse 88   Ht 5\' 7"  (1.702 m)   Wt 160 lb  (72.6 kg)   LMP 04/06/2013   BMI 25.06 kg/m  Physical Exam Constitutional:      General: She is not in acute distress.    Appearance: She is well-developed.  Cardiovascular:     Comments: No peripheral edema Skin:    General: Skin is warm and dry.  Neurological:     Mental Status: She is alert and oriented to person, place, and time.     Sensory: No sensory deficit.     Coordination: Coordination normal.     Gait: Gait normal.     Deep Tendon Reflexes: Reflexes are normal and symmetric.    Ambulatory status normal with no assistive devices Ortho Exam Left shoulder no tenderness normal range of motion no instability normal muscle tone and strength skin normal  Right shoulder tenderness over the anterior joint line rotator interval.  Decreased range of motion actively with active abduction less than 90 degrees active flexion less than 90 degrees painful flexion arc 90 degrees to 110 degrees  With her arm at her side she had normal external rotation  She had weakness but negative drop test for rotator cuff PROVOCATIVE TESTS  MEDICAL DECISION MAKING  A.  Encounter Diagnoses  Name Primary?  . Acute pain of right shoulder Yes  . Bursitis of right shoulder     B. DATA ANALYSED:    IMAGING: Independent interpretation of images: X-rays right shoulder were normal they were done in the office  Orders: None  Outside records reviewed: Negative  C. MANAGEMENT   Probable acute bursitis with some tendinitis in the right shoulder recommend 1 week of rest come back in a week inject right shoulder  Patient has history of stomach irritation from aspirin so we put her on Prilosec and start her on meloxicam  She is advised to rest the shoulder no heavy lifting and see me in a week  Acute uncomplicated moderate risk due to injection and diabetic   Procedure note the subacromial injection shoulder RIGHT    Verbal consent was obtained to inject the  RIGHT   Shoulder  Timeout  was completed to confirm the injection site is a subacromial space of the  RIGHT  shoulder   Medication used Depo-Medrol 40 mg and lidocaine 1% 3 cc  Anesthesia was provided by ethyl chloride  The injection was performed in the RIGHT  posterior subacromial space. After pinning the skin with alcohol and anesthetized the skin with ethyl chloride the subacromial space was injected using a 20-gauge needle. There were no complications  Sterile dressing was applied.    Meds ordered this encounter  Medications  . meloxicam (MOBIC) 7.5 MG tablet    Sig: Take 1 tablet (7.5 mg total) by mouth daily.    Dispense:  30 tablet    Refill:  5  . omeprazole (PRILOSEC) 20 MG capsule    Sig: Take 1 capsule (20 mg total) by mouth daily.    Dispense:  30 capsule    Refill:  0

## 2020-08-22 ENCOUNTER — Other Ambulatory Visit: Payer: Self-pay

## 2020-08-22 ENCOUNTER — Encounter: Payer: Self-pay | Admitting: Orthopedic Surgery

## 2020-08-22 ENCOUNTER — Ambulatory Visit (INDEPENDENT_AMBULATORY_CARE_PROVIDER_SITE_OTHER): Payer: BC Managed Care – PPO | Admitting: Orthopedic Surgery

## 2020-08-22 VITALS — BP 167/93 | HR 100 | Ht 67.0 in | Wt 160.0 lb

## 2020-08-22 DIAGNOSIS — M7551 Bursitis of right shoulder: Secondary | ICD-10-CM

## 2020-08-22 NOTE — Progress Notes (Signed)
Chief Complaint  Patient presents with   Shoulder Pain    RT Shoulder   Morgan [name] is 59 years old she had acute shoulder pain we gave her an injection put her on some medication took her out of work told her to rest her shoulder as she comes back significantly improved with better range of motion less pain but not 100%  Her exam shows she can abduct her arm 90 degrees flex it just past 90 degrees has better abduction as well  No major strength deficits are noted she is neurovascularly intact  She will be out of work this week as she continues to work on some range of motion exercises with the [place] exercise protocol she is to continue out of work for a week no heavy lifting  She will come back in a week if she still symptomatic and if not she will cancel her appointment and get a note to return back to work that day

## 2020-08-22 NOTE — Patient Instructions (Signed)
Note OOW 1 week

## 2020-08-29 ENCOUNTER — Encounter: Payer: Self-pay | Admitting: Orthopedic Surgery

## 2020-08-29 ENCOUNTER — Other Ambulatory Visit: Payer: Self-pay

## 2020-08-29 ENCOUNTER — Ambulatory Visit (INDEPENDENT_AMBULATORY_CARE_PROVIDER_SITE_OTHER): Payer: BC Managed Care – PPO | Admitting: Orthopedic Surgery

## 2020-08-29 VITALS — BP 181/116 | HR 88 | Ht 67.0 in

## 2020-08-29 DIAGNOSIS — M7551 Bursitis of right shoulder: Secondary | ICD-10-CM

## 2020-08-29 NOTE — Patient Instructions (Addendum)
Continue exercises    OOW THRU 20 SEPT

## 2020-08-29 NOTE — Progress Notes (Signed)
Follow-up visit  Chief Complaint  Patient presents with  . Shoulder Pain    Bursitis of right shoulder 08/15/20, 08/22/20.     Assessment and plan 59 year old female being followed for painful right shoulder thought to be impingement syndrome and bursitis without rotator cuff tear comes in today with improved range of motion but has not regained full range of motion  She will continue home exercises and follow-up with [place] in 2-1/2 weeks  Encounter Diagnosis  Name Primary?  . Bursitis of right shoulder Yes     History 59 year old female seen twice for shoulder pain, we saw her on the 19th and 26 August she had acute pain right shoulder thought to be acute bursitis she had an injection she was placed on home exercises strengthening and stretching program and has made steady improvement each time back.  Today she has decreased internal rotation compared right to left she has decreased flexion compared right to left with her flexion now 120 degrees her cuff strength still seems to be pretty good

## 2020-09-06 ENCOUNTER — Other Ambulatory Visit: Payer: Self-pay | Admitting: Orthopedic Surgery

## 2020-09-06 DIAGNOSIS — M25511 Pain in right shoulder: Secondary | ICD-10-CM

## 2020-09-16 ENCOUNTER — Ambulatory Visit (INDEPENDENT_AMBULATORY_CARE_PROVIDER_SITE_OTHER): Payer: BC Managed Care – PPO | Admitting: Orthopedic Surgery

## 2020-09-16 ENCOUNTER — Encounter: Payer: Self-pay | Admitting: Orthopedic Surgery

## 2020-09-16 ENCOUNTER — Other Ambulatory Visit: Payer: Self-pay

## 2020-09-16 DIAGNOSIS — M25511 Pain in right shoulder: Secondary | ICD-10-CM | POA: Diagnosis not present

## 2020-09-16 MED ORDER — MELOXICAM 7.5 MG PO TABS: 7.5000 mg | ORAL_TABLET | Freq: Every day | ORAL | 5 refills | Status: DC

## 2020-09-16 NOTE — Progress Notes (Signed)
Chief Complaint  Patient presents with  . Follow-up    Recheck on right shoulder    Follow-up acute bursitis right shoulder. Patient says she has good days and then the shoulder feel bad again she is not having any weakness she is regained her range of motion just has some pain at terminal range of motion  Her exam shows full range of motion in all planes with good strength in the rotator cuff and abduction as well as empty can position  Mild signs of impingement at terminal flexion and abduction   Regard to continue to work on home exercises refill the meloxicam we will see her in 2 weeks should be able to go back to work at that point  Meds ordered this encounter  Medications  . meloxicam (MOBIC) 7.5 MG tablet    Sig: Take 1 tablet (7.5 mg total) by mouth daily.    Dispense:  30 tablet    Refill:  5    Encounter Diagnosis  Name Primary?  . Acute pain of right shoulder

## 2020-09-16 NOTE — Patient Instructions (Signed)
Take the medication for 2 more weeks and stay out 2 more weeks

## 2020-09-30 ENCOUNTER — Ambulatory Visit (INDEPENDENT_AMBULATORY_CARE_PROVIDER_SITE_OTHER): Payer: BC Managed Care – PPO | Admitting: Orthopedic Surgery

## 2020-09-30 ENCOUNTER — Other Ambulatory Visit: Payer: Self-pay

## 2020-09-30 ENCOUNTER — Encounter: Payer: Self-pay | Admitting: Orthopedic Surgery

## 2020-09-30 DIAGNOSIS — M7551 Bursitis of right shoulder: Secondary | ICD-10-CM

## 2020-09-30 NOTE — Progress Notes (Signed)
Chief Complaint  Patient presents with  . Follow-up    Recheck on right shoulder   Morgan [name] was seen today for follow-up.  Diagnoses and all orders for this visit:  Bursitis of right shoulder    Return to work follow-up as needed   hpi: Omeprazole caused a rash; hives.  [name] says she has made significant improvement not having any more pain  Shoulder exam shows normal strength and range of motion  She did get a rash with the omeprazole I placed it in her orders is a rash with hives and seemed to get better after she stopped taking it  Patient may return to work follow-up with [place] as needed  Continue meloxicam Allergies  Allergen Reactions  . Omeprazole Hives and Rash  . Aspirin     Makes stomach hurt badly

## 2020-09-30 NOTE — Patient Instructions (Signed)
Work note return with no restrictions

## 2020-10-08 ENCOUNTER — Other Ambulatory Visit: Payer: Self-pay | Admitting: Orthopedic Surgery

## 2020-10-08 DIAGNOSIS — M25511 Pain in right shoulder: Secondary | ICD-10-CM

## 2020-10-22 ENCOUNTER — Other Ambulatory Visit: Payer: Self-pay | Admitting: Orthopedic Surgery

## 2020-10-22 DIAGNOSIS — M25511 Pain in right shoulder: Secondary | ICD-10-CM

## 2021-09-02 ENCOUNTER — Emergency Department (HOSPITAL_COMMUNITY)
Admission: EM | Admit: 2021-09-02 | Discharge: 2021-09-03 | Disposition: A | Discharge status: Home / Self Care | Payer: BC Managed Care – PPO | Attending: Emergency Medicine | Admitting: Emergency Medicine

## 2021-09-02 ENCOUNTER — Encounter (HOSPITAL_COMMUNITY): Payer: Self-pay | Admitting: *Deleted

## 2021-09-02 ENCOUNTER — Other Ambulatory Visit: Payer: Self-pay

## 2021-09-02 DIAGNOSIS — I10 Essential (primary) hypertension: Secondary | ICD-10-CM | POA: Diagnosis not present

## 2021-09-02 DIAGNOSIS — M79672 Pain in left foot: Secondary | ICD-10-CM | POA: Diagnosis not present

## 2021-09-02 DIAGNOSIS — Z7984 Long term (current) use of oral hypoglycemic drugs: Secondary | ICD-10-CM | POA: Diagnosis not present

## 2021-09-02 DIAGNOSIS — Z87891 Personal history of nicotine dependence: Secondary | ICD-10-CM | POA: Diagnosis not present

## 2021-09-02 DIAGNOSIS — R631 Polydipsia: Secondary | ICD-10-CM | POA: Diagnosis not present

## 2021-09-02 DIAGNOSIS — R739 Hyperglycemia, unspecified: Secondary | ICD-10-CM

## 2021-09-02 DIAGNOSIS — E11618 Type 2 diabetes mellitus with other diabetic arthropathy: Secondary | ICD-10-CM | POA: Diagnosis not present

## 2021-09-02 DIAGNOSIS — N390 Urinary tract infection, site not specified: Secondary | ICD-10-CM | POA: Diagnosis not present

## 2021-09-02 DIAGNOSIS — Z96653 Presence of artificial knee joint, bilateral: Secondary | ICD-10-CM | POA: Insufficient documentation

## 2021-09-02 DIAGNOSIS — E1165 Type 2 diabetes mellitus with hyperglycemia: Secondary | ICD-10-CM | POA: Insufficient documentation

## 2021-09-02 DIAGNOSIS — R2241 Localized swelling, mass and lump, right lower limb: Secondary | ICD-10-CM | POA: Diagnosis not present

## 2021-09-02 DIAGNOSIS — Z79899 Other long term (current) drug therapy: Secondary | ICD-10-CM | POA: Insufficient documentation

## 2021-09-02 DIAGNOSIS — M79671 Pain in right foot: Secondary | ICD-10-CM | POA: Diagnosis not present

## 2021-09-02 DIAGNOSIS — E11610 Type 2 diabetes mellitus with diabetic neuropathic arthropathy: Secondary | ICD-10-CM | POA: Diagnosis not present

## 2021-09-02 LAB — BLOOD GAS, VENOUS
Acid-Base Excess: 1.3 mmol/L (ref 0.0–2.0)
Bicarbonate: 22.1 mmol/L (ref 20.0–28.0)
FIO2: 21
O2 Saturation: 28.1 %
Patient temperature: 36.9
pCO2, Ven: 54.2 mmHg (ref 44.0–60.0)
pH, Ven: 7.315 (ref 7.250–7.430)
pO2, Ven: <31 mmHg — CL (ref 32.0–45.0)

## 2021-09-02 LAB — URINALYSIS, ROUTINE W REFLEX MICROSCOPIC
Bilirubin Urine: NEGATIVE
Glucose, UA: >=500 mg/dL — AB
Ketones, ur: NEGATIVE mg/dL
Nitrite: NEGATIVE
Protein, ur: 30 mg/dL — AB
Specific Gravity, Urine: 1.025 (ref 1.005–1.030)
pH: 5 (ref 5.0–8.0)

## 2021-09-02 LAB — BASIC METABOLIC PANEL
Anion gap: 9 (ref 5–15)
BUN: 48 mg/dL — ABNORMAL HIGH (ref 6–20)
CO2: 24 mmol/L (ref 22–32)
Calcium: 9.3 mg/dL (ref 8.9–10.3)
Chloride: 93 mmol/L — ABNORMAL LOW (ref 98–111)
Creatinine, Ser: 1.74 mg/dL — ABNORMAL HIGH (ref 0.44–1.00)
GFR, Estimated: 33 mL/min — ABNORMAL LOW (ref >60)
Glucose, Bld: 528 mg/dL (ref 70–99)
Potassium: 4.9 mmol/L (ref 3.5–5.1)
Sodium: 126 mmol/L — ABNORMAL LOW (ref 135–145)

## 2021-09-02 LAB — CBG MONITORING, ED
Glucose-Capillary: 494 mg/dL — ABNORMAL HIGH (ref 70–99)
Glucose-Capillary: 507 mg/dL (ref 70–99)
Glucose-Capillary: 319 mg/dL — ABNORMAL HIGH (ref 70–99)

## 2021-09-02 LAB — URINALYSIS, MICROSCOPIC (REFLEX): WBC, UA: >50 WBC/hpf (ref 0–5)

## 2021-09-02 LAB — CBC
HCT: 45.4 % (ref 36.0–46.0)
Hemoglobin: 14.8 g/dL (ref 12.0–15.0)
MCH: 30 pg (ref 26.0–34.0)
MCHC: 32.6 g/dL (ref 30.0–36.0)
MCV: 92.1 fL (ref 80.0–100.0)
Platelets: 304 10*3/uL (ref 150–400)
RBC: 4.93 MIL/uL (ref 3.87–5.11)
RDW: 13 % (ref 11.5–15.5)
WBC: 7.3 10*3/uL (ref 4.0–10.5)
nRBC: 0 % (ref 0.0–0.2)

## 2021-09-02 MED ORDER — METFORMIN HCL 500 MG PO TABS: 1000.0000 mg | ORAL_TABLET | Freq: Once | ORAL | Status: DC

## 2021-09-02 MED ORDER — INSULIN ASPART 100 UNIT/ML IJ SOLN
6.0000 [IU] | Freq: Once | INTRAMUSCULAR | Status: AC
  Administered 2021-09-02: 6 [IU] via SUBCUTANEOUS
  Filled 2021-09-02: qty 1

## 2021-09-02 MED ORDER — SODIUM CHLORIDE 0.9 % IV BOLUS
1000.0000 mL | Freq: Once | INTRAVENOUS | Status: AC
  Administered 2021-09-02: 1000 mL via INTRAVENOUS

## 2021-09-02 MED ORDER — SODIUM CHLORIDE 0.9 % IV BOLUS: 1000.0000 mL | Freq: Once | INTRAVENOUS | Status: DC

## 2021-09-02 NOTE — ED Notes (Signed)
Date and time results received: 09/02/21 2020  Test: pO2 Critical Value: <31.0  Name of Provider Notified: [place], [place]

## 2021-09-02 NOTE — ED Provider Notes (Signed)
Care assumed from [facility], [place], patient with hyperglycemia without evidence of DKA pending urinalysis.  Urinalysis does show significant pyuria and many bacteria.  Urinary tract infection probably accounts for some of her hyperglycemia.  She is given a dose of cephalexin and also restarted on her metformin.  She is discharged with prescriptions for both cephalexin and metformin, follow-up with PCP.  Return precautions discussed.   Dione Booze, MD 09/03/21 [phone]

## 2021-09-02 NOTE — ED Provider Notes (Signed)
[facility] EMERGENCY DEPARTMENT Provider Note   CSN: [id number] Arrival date & time: 09/02/21  1612     History Chief Complaint  Patient presents with   Hyperglycemia    Morgan [name] is a 60 y.o. female.  HPI  60 year old female with a history of cataracts, diabetes, B12 disorder, hyperlipidemia, hypertension, who presents to the emergency department today for evaluation of hyperglycemia.  She states that she was at her foot doctor prior to arrival and noted that she was out of her medications so they checked her sugar in the office and it was above 500 so she was sent here for further evaluation.  She states she has been out for about 3 to 4 months.  She has noted polydipsia.  She denies any vomiting, vision changes or weakness.  She denies any recent illnesses or chest pain.  Past Medical History:  Diagnosis Date   Cataract    Diabetes mellitus without complication (HCC)    Disorder of vitamin B12 04/17/2013   Elevated while taking 1000 mcg of Vitamin B12 PO   Hyperlipidemia    Hypertension     Patient Active Problem List   Diagnosis Date Noted   Charcot foot due to diabetes mellitus (HCC) 07/23/2017   Diabetes mellitus (HCC) 07/23/2017   Hypertension 07/23/2017   Diabetes mellitus type II, controlled, with no complications (HCC) 04/19/2013   Disorder of vitamin B12 04/17/2013    Past Surgical History:  Procedure Laterality Date   BILATERAL KNEE ARTHROSCOPY Bilateral    BREAST BIOPSY Left    Benign   CATARACT EXTRACTION W/ INTRAOCULAR LENS IMPLANT Bilateral      OB History   No obstetric history on file.     Family History  Problem Relation Age of Onset   Stroke Father    Diabetes Mother    Hypertension Mother     Social History   Tobacco Use   Smoking status: Former    Packs/day: 1.00    Years: 15.00    Pack years: 15.00    Types: Cigarettes    Quit date: 04/17/2008    Years since quitting: 13.3   Smokeless tobacco: Never  Substance Use Topics    Alcohol use: No   Drug use: No    Home Medications Prior to Admission medications   Medication Sig Start Date End Date Taking? Authorizing Provider  acetaminophen (TYLENOL) 500 MG tablet Take 1,000 mg by mouth every 6 (six) hours as needed.   Yes [provider]  atorvastatin (LIPITOR) 20 MG tablet Take 20 mg by mouth daily. Patient not taking: No sig reported 08/11/20   [provider]  lisinopril (ZESTRIL) 20 MG tablet Take 20 mg by mouth daily. Patient not taking: No sig reported    [provider]  lisinopril-hydrochlorothiazide (PRINZIDE,ZESTORETIC) 10-12.5 MG per tablet Take 1 tablet by mouth daily.  Patient not taking: No sig reported    [provider]  meloxicam (MOBIC) 7.5 MG tablet Take 1 tablet (7.5 mg total) by mouth daily. Patient not taking: No sig reported 09/16/20   Vickki Hearing, MD  metFORMIN (GLUCOPHAGE) 500 MG tablet SMARTSIG:1 Tablet(s) By Mouth Morning-Evening Patient not taking: No sig reported 07/09/20   [provider]  omeprazole (PRILOSEC) 20 MG capsule TAKE 1 CAPSULE BY MOUTH EVERY DAY Patient not taking: No sig reported 10/23/20   Vickki Hearing, MD    Allergies    Omeprazole and Aspirin  Review of Systems   Review of Systems  Constitutional:  Negative for chills and fever.  HENT:  Negative for ear pain and sore throat.   Eyes:  Negative for pain and visual disturbance.  Respiratory:  Negative for cough and shortness of breath.   Cardiovascular:  Negative for chest pain.  Gastrointestinal:  Negative for abdominal pain, constipation, diarrhea, nausea and vomiting.  Endocrine: Positive for polydipsia and polyphagia.  Genitourinary:  Negative for dysuria and hematuria.  Musculoskeletal:  Negative for back pain.  Skin:  Negative for rash.  Neurological:  Negative for seizures and syncope.  All other systems reviewed and are negative.  Physical Exam Updated Vital Signs BP (!) 164/95   Pulse 80    Temp 98.1 F (36.7 C)   Resp 15   LMP 04/06/2013   SpO2 97%   Physical Exam Vitals and nursing note reviewed.  Constitutional:      General: She is not in acute distress.    Appearance: She is well-developed.  HENT:     Head: Normocephalic and atraumatic.     Mouth/Throat:     Mouth: Mucous membranes are dry.  Eyes:     Conjunctiva/sclera: Conjunctivae normal.  Cardiovascular:     Rate and Rhythm: Normal rate and regular rhythm.     Heart sounds: Normal heart sounds. No murmur heard. Pulmonary:     Effort: Pulmonary effort is normal. No respiratory distress.     Breath sounds: Normal breath sounds. No wheezing, rhonchi or rales.  Abdominal:     General: Bowel sounds are normal.     Palpations: Abdomen is soft.     Tenderness: There is no abdominal tenderness. There is no guarding or rebound.  Musculoskeletal:     Cervical back: Neck supple.  Skin:    General: Skin is warm and dry.  Neurological:     Mental Status: She is alert.    ED Results / Procedures / Treatments   Labs (all labs ordered are listed, but only abnormal results are displayed) Labs Reviewed  BASIC METABOLIC PANEL - Abnormal; Notable for the following components:      Result Value   Sodium 126 (*)    Chloride 93 (*)    Glucose, Bld 528 (*)    BUN 48 (*)    Creatinine, Ser 1.74 (*)    GFR, Estimated 33 (*)    All other components within normal limits  BLOOD GAS, VENOUS - Abnormal; Notable for the following components:   pO2, Ven <31.0 (*)    All other components within normal limits  CBG MONITORING, ED - Abnormal; Notable for the following components:   Glucose-Capillary 494 (*)    All other components within normal limits  CBG MONITORING, ED - Abnormal; Notable for the following components:   Glucose-Capillary 507 (*)    All other components within normal limits  CBG MONITORING, ED - Abnormal; Notable for the following components:   Glucose-Capillary 319 (*)    All other components within  normal limits  CBC  URINALYSIS, ROUTINE W REFLEX MICROSCOPIC  CBG MONITORING, ED    EKG None  Radiology No results found.  Procedures Procedures   Medications Ordered in ED Medications  sodium chloride 0.9 % bolus 1,000 mL (0 mLs Intravenous Stopped 09/02/21 2244)  sodium chloride 0.9 % bolus 1,000 mL (0 mLs Intravenous Stopped 09/02/21 2244)  insulin aspart (novoLOG) injection 6 Units (6 Units Subcutaneous Given 09/02/21 2029)    ED Course  I have reviewed the triage vital signs and the nursing notes.  Pertinent labs & imaging results that were available during my care of the patient were reviewed by me and considered in my medical decision making (see chart for details).    MDM Rules/Calculators/A&P                          60 year old female presenting for evaluation of hyperglycemia.  Has been out of her diabetes meds for several months now.  She has some increased thirst but is otherwise largely asymptomatic.  Reviewed/interpreted labs CBC with mild leukocytosis BMP with elevated blood glucose, normal bicarb and nor elevated anion gap. She has some elevated kidney function that is slightly worse than labs 8 years ago VBG without acidemia, pco2 wnl UA pending at shift change  Patient was given IV fluids and insulin in the emergency department.  Her blood sugar improved to less than 350.  She has no evidence of DKA on evaluation.  Feel she is appropriate for discharge home with close follow-up with her PCP so that they can restart her medications.  Care transitioned to Dr. Preston Fleeting at shift change pending UA. If neg pt felt to be appropriate for d/c home.   Final Clinical Impression(s) / ED Diagnoses Final diagnoses:  Hyperglycemia    Rx / DC Orders ED Discharge Orders     None        Rayne Du 09/02/21 2341    Pollyann Savoy, MD 09/08/21 1245

## 2021-09-02 NOTE — ED Triage Notes (Signed)
Out of medication for months, blood sugar is high

## 2021-09-02 NOTE — ED Notes (Signed)
Date and time results received: 09/02/21 2000  Test: Glucose Critical Value: 528  Name of Provider Notified: Leonia Corona, PA

## 2021-09-02 NOTE — Discharge Instructions (Addendum)
You will need to follow up with your regular pcp in the next 3-5 days for reassessment. Make sure to stay well hydrated in the meantime  Please return to the emergency department for any new or worsening symptoms.

## 2021-09-03 MED ORDER — CEPHALEXIN 500 MG PO CAPS
500.0000 mg | ORAL_CAPSULE | Freq: Once | ORAL | Status: AC
  Administered 2021-09-03: 500 mg via ORAL
  Filled 2021-09-03: qty 1

## 2021-09-03 MED ORDER — METFORMIN HCL 500 MG PO TABS: ORAL_TABLET | ORAL | 0 refills | Status: DC

## 2021-09-03 MED ORDER — METFORMIN HCL 500 MG PO TABS
500.0000 mg | ORAL_TABLET | Freq: Once | ORAL | Status: AC
  Administered 2021-09-03: 500 mg via ORAL
  Filled 2021-09-03: qty 1

## 2021-09-03 MED ORDER — CEPHALEXIN 500 MG PO CAPS: 500.0000 mg | ORAL_CAPSULE | Freq: Three times a day (TID) | ORAL | 0 refills | Status: DC

## 2021-09-11 DIAGNOSIS — E1129 Type 2 diabetes mellitus with other diabetic kidney complication: Secondary | ICD-10-CM | POA: Diagnosis not present

## 2021-09-11 DIAGNOSIS — G600 Hereditary motor and sensory neuropathy: Secondary | ICD-10-CM | POA: Diagnosis not present

## 2021-09-11 DIAGNOSIS — I10 Essential (primary) hypertension: Secondary | ICD-10-CM | POA: Diagnosis not present

## 2021-09-11 DIAGNOSIS — Z6823 Body mass index (BMI) 23.0-23.9, adult: Secondary | ICD-10-CM | POA: Diagnosis not present

## 2021-09-18 DIAGNOSIS — E782 Mixed hyperlipidemia: Secondary | ICD-10-CM | POA: Diagnosis not present

## 2021-09-18 DIAGNOSIS — E1122 Type 2 diabetes mellitus with diabetic chronic kidney disease: Secondary | ICD-10-CM | POA: Diagnosis not present

## 2021-09-18 DIAGNOSIS — G600 Hereditary motor and sensory neuropathy: Secondary | ICD-10-CM | POA: Diagnosis not present

## 2021-09-18 DIAGNOSIS — Z0001 Encounter for general adult medical examination with abnormal findings: Secondary | ICD-10-CM | POA: Diagnosis not present

## 2021-09-18 DIAGNOSIS — Z1389 Encounter for screening for other disorder: Secondary | ICD-10-CM | POA: Diagnosis not present

## 2021-09-18 DIAGNOSIS — N183 Chronic kidney disease, stage 3 unspecified: Secondary | ICD-10-CM | POA: Diagnosis not present

## 2021-09-18 DIAGNOSIS — Z23 Encounter for immunization: Secondary | ICD-10-CM | POA: Diagnosis not present

## 2021-09-18 DIAGNOSIS — Z6823 Body mass index (BMI) 23.0-23.9, adult: Secondary | ICD-10-CM | POA: Diagnosis not present

## 2021-09-18 DIAGNOSIS — E1165 Type 2 diabetes mellitus with hyperglycemia: Secondary | ICD-10-CM | POA: Diagnosis not present

## 2021-09-18 DIAGNOSIS — E1129 Type 2 diabetes mellitus with other diabetic kidney complication: Secondary | ICD-10-CM | POA: Diagnosis not present

## 2021-09-18 DIAGNOSIS — I10 Essential (primary) hypertension: Secondary | ICD-10-CM | POA: Diagnosis not present

## 2021-10-07 DIAGNOSIS — R600 Localized edema: Secondary | ICD-10-CM | POA: Diagnosis not present

## 2022-07-17 ENCOUNTER — Telehealth: Payer: Self-pay | Admitting: *Deleted

## 2022-07-17 NOTE — Chronic Care Management (AMB) (Signed)
  Care Coordination  Note  07/17/2022 Name: Morgan [name] MRN: [id number] DOB: [birth year]  Morgan [name] is a 61 y.o. year old female who is a primary care patient of Ladon Applebaum. I reached out to [employer] by phone today to offer care coordination services.       Follow up plan: Unsuccessful telephone outreach attempt made. A HIPAA compliant phone message was left for the patient providing contact information and requesting a return call.  The care guide will reach out to the patient again over the next 7 days.  If patient calls provider office to request assistance with care coordination needs, please contact the care guide at the number below.   [facility]  Care Coordination Care Guide  Direct Dial: [phone]

## 2022-07-22 NOTE — Chronic Care Management (AMB) (Signed)
  Care Management   Outreach Note  07/22/2022 Name: Morgan [name] MRN: [id number] DOB: Nov 14, 1961  A second unsuccessful telephone outreach was attempted today. The patient was referred to the case management team for assistance with care management and care coordination.   Follow Up Plan:  A HIPAA compliant phone message was left for the patient providing contact information and requesting a return call.  The care management team will reach out to the patient again over the next 7 days.  If patient returns call to provider office, please advise to call Embedded Care Management Care Guide Misty Stanley* at [phone].Misty Stanley [facility]  Care Coordination Care Guide  Direct Dial: [phone]

## 2022-07-29 NOTE — Chronic Care Management (AMB) (Signed)
  Care Coordination  Outreach Note  07/29/2022 Name: Morgan [name] MRN: [id number] DOB: 08/28/1961   Care Coordination Outreach Attempts  A third unsuccessful outreach was attempted today to offer the patient with information about available care coordination services as a benefit of their health plan.   Follow Up Plan:  No further outreach attempts will be made at this time. We have been unable to contact the patient to offer or enroll patient in care coordination services  Encounter Outcome:  No Answer   Gwenevere Ghazi  Care Coordination Care Guide  Direct Dial: [phone]

## 2022-08-09 ENCOUNTER — Other Ambulatory Visit: Payer: Self-pay | Admitting: *Deleted

## 2022-08-09 NOTE — Patient Outreach (Signed)
  Care Coordination   08/09/2022  Name: Morgan [name] MRN: [id number] DOB: [birth year]   Care Coordination Outreach Attempts:  An unsuccessful telephone outreach was attempted today to offer the patient information about available care coordination services as a benefit of their health plan. HIPAA compliant messages left on voicemail, providing contact information for CSW, encouraging patient to return CSW's call at her earliest convenience.  Follow Up Plan:  Additional outreach attempts will be made to offer the patient care coordination information and services.   Encounter Outcome:  No Answer.   Care Coordination Interventions Activated:  No.    Care Coordination Interventions:  No, not indicated.    Danford Bad, BSW, MSW, LCSW  Licensed Restaurant manager, [employer] [facility]  Mailing [place] [address] [place], [place] [address] Physical Address[address], [place], [place] [address] Toll Free Main # [phone] Fax # [phone] Cell # [phone] [email or web address]

## 2022-08-12 ENCOUNTER — Encounter: Payer: Self-pay | Admitting: *Deleted

## 2022-08-12 ENCOUNTER — Ambulatory Visit: Payer: Self-pay | Admitting: *Deleted

## 2022-08-12 NOTE — Patient Outreach (Signed)
  Care Coordination   Initial Visit Note   08/12/2022  Name: Morgan [name] MRN: [id number] DOB: 17-Jun-1961  Morgan [name] is a 61 y.o. year old female who sees Avis Epley, [place] for primary care. I spoke with patient's mother, Duana Benedict by phone today.  What matters to the patients health and wellness today?  No Interventions Indicated.   Goals Addressed   None     SDOH assessments and interventions completed:  Yes.   SDOH Interventions Today    Flowsheet Row Most Recent Value  SDOH Interventions   Food Insecurity Interventions Intervention Not Indicated, Other (Comment)  [Verified by Mother, Britta Mccreedy Tregre]  Financial Strain Interventions Intervention Not Indicated, Other (Comment)  [Verified by Mother, Britta Mccreedy Whisman]  Housing Interventions Intervention Not Indicated, Other (Comment)  [Verified by Mother, Britta Mccreedy Dimino]  Physical Activity Interventions Intervention Not Indicated, Other (Comments)  [Verified by Mother, Britta Mccreedy Helser]  Stress Interventions Intervention Not Indicated, Other (Comment)  [Verified by Mother, Britta Mccreedy Cervone]  Social Connections Interventions Intervention Not Indicated, Other (Comment)  [Verified by Mother, Britta Mccreedy Hirth]  Transportation Interventions Intervention Not Indicated, Other (Comment)  [Verified by Mother, Britta Mccreedy Kluth]        Care Coordination Interventions Activated:  Yes.   Care Coordination Interventions:  Yes, provided.   Follow up plan: No further intervention required.   Encounter Outcome:  Pt. Visit Completed.   Danford Bad, BSW, MSW, LCSW  Licensed Restaurant manager, fast food [facility]  Mailing [place] [address], [place], [place] [address] Physical Address[address], [place], [place] [address] Toll Free Main # [phone] Fax # [phone] Cell # [phone] [email or web address]

## 2022-08-12 NOTE — Patient Instructions (Signed)
Visit Information  Thank you for taking time to visit with me today. Please don't hesitate to contact me if I can be of assistance to you.   Please call the care guide team at [phone] if you need to cancel or reschedule your appointment.   If you are experiencing a Mental Health or Behavioral Health Crisis or need someone to talk to, please call the Suicide and Crisis Lifeline: 988 call the [place] National Suicide Prevention Lifeline: 1-800-273-8255 or TTY: 1-800-799-4 TTY (1-800-799-4889) to talk to a trained counselor call 1-800-273-TALK (toll free, 24 hour hotline) go to [facility] [address], [place] ([phone]) call the [place] Crisis Line: 800-939-9988 call 911  Patient verbalizes understanding of instructions and care plan provided today and agrees to view in MyChart. Active MyChart status and patient understanding of how to access instructions and care plan via MyChart confirmed with patient.     No further follow up required.  Tabor Denham, BSW, MSW, LCSW  Licensed Clinical Social Worker  [facility] [place]  Mailing Address-[address], [place], [place] [address] Physical Address-[address], [place], [place] [address] Toll Free Main # [phone] Fax # [phone] Cell # [phone] [email or web address] [email or web address]            

## 2023-10-17 ENCOUNTER — Other Ambulatory Visit: Payer: Self-pay

## 2023-10-17 ENCOUNTER — Encounter (HOSPITAL_COMMUNITY): Payer: Self-pay

## 2023-10-17 ENCOUNTER — Emergency Department (HOSPITAL_COMMUNITY)
Admission: EM | Admit: 2023-10-17 | Discharge: 2023-10-18 | Disposition: A | Discharge status: Home / Self Care | Payer: Self-pay | Attending: Emergency Medicine | Admitting: Emergency Medicine

## 2023-10-17 DIAGNOSIS — Z7984 Long term (current) use of oral hypoglycemic drugs: Secondary | ICD-10-CM | POA: Insufficient documentation

## 2023-10-17 DIAGNOSIS — I10 Essential (primary) hypertension: Secondary | ICD-10-CM

## 2023-10-17 DIAGNOSIS — E1165 Type 2 diabetes mellitus with hyperglycemia: Secondary | ICD-10-CM | POA: Insufficient documentation

## 2023-10-17 LAB — CBC WITH DIFFERENTIAL/PLATELET
Abs Immature Granulocytes: 0.03 10*3/uL (ref 0.00–0.07)
Basophils Absolute: 0 10*3/uL (ref 0.0–0.1)
Basophils Relative: 1 %
Eosinophils Absolute: 0.3 10*3/uL (ref 0.0–0.5)
Eosinophils Relative: 4 %
HCT: 36.9 % (ref 36.0–46.0)
Hemoglobin: 12.3 g/dL (ref 12.0–15.0)
Immature Granulocytes: 0 %
Lymphocytes Relative: 18 %
Lymphs Abs: 1.3 10*3/uL (ref 0.7–4.0)
MCH: 29.3 pg (ref 26.0–34.0)
MCHC: 33.3 g/dL (ref 30.0–36.0)
MCV: 87.9 fL (ref 80.0–100.0)
Monocytes Absolute: 0.6 10*3/uL (ref 0.1–1.0)
Monocytes Relative: 8 %
Neutro Abs: 4.9 10*3/uL (ref 1.7–7.7)
Neutrophils Relative %: 69 %
Platelets: 271 10*3/uL (ref 150–400)
RBC: 4.2 MIL/uL (ref 3.87–5.11)
RDW: 12.3 % (ref 11.5–15.5)
WBC: 7 10*3/uL (ref 4.0–10.5)
nRBC: 0 % (ref 0.0–0.2)

## 2023-10-17 LAB — CBG MONITORING, ED
Glucose-Capillary: 439 mg/dL — ABNORMAL HIGH (ref 70–99)
Glucose-Capillary: 476 mg/dL — ABNORMAL HIGH (ref 70–99)

## 2023-10-17 LAB — URINALYSIS, ROUTINE W REFLEX MICROSCOPIC
Bilirubin Urine: NEGATIVE
Glucose, UA: >=500 mg/dL — AB
Ketones, ur: 5 mg/dL — AB
Nitrite: NEGATIVE
Protein, ur: >=300 mg/dL — AB
Specific Gravity, Urine: 1.012 (ref 1.005–1.030)
WBC, UA: >50 WBC/hpf (ref 0–5)
pH: 5 (ref 5.0–8.0)

## 2023-10-17 LAB — BASIC METABOLIC PANEL
Anion gap: 12 (ref 5–15)
BUN: 31 mg/dL — ABNORMAL HIGH (ref 8–23)
CO2: 22 mmol/L (ref 22–32)
Calcium: 8.5 mg/dL — ABNORMAL LOW (ref 8.9–10.3)
Chloride: 87 mmol/L — ABNORMAL LOW (ref 98–111)
Creatinine, Ser: 1.46 mg/dL — ABNORMAL HIGH (ref 0.44–1.00)
GFR, Estimated: 41 mL/min — ABNORMAL LOW (ref >60)
Glucose, Bld: 514 mg/dL (ref 70–99)
Potassium: 4.4 mmol/L (ref 3.5–5.1)
Sodium: 121 mmol/L — ABNORMAL LOW (ref 135–145)

## 2023-10-17 MED ORDER — METFORMIN HCL 500 MG PO TABS
500.0000 mg | ORAL_TABLET | Freq: Once | ORAL | Status: AC
  Administered 2023-10-17: 500 mg via ORAL
  Filled 2023-10-17: qty 1

## 2023-10-17 MED ORDER — INSULIN ASPART 100 UNIT/ML IJ SOLN
5.0000 [IU] | Freq: Once | INTRAMUSCULAR | Status: AC
  Administered 2023-10-17: 5 [IU] via INTRAVENOUS
  Filled 2023-10-17: qty 1

## 2023-10-17 MED ORDER — LACTATED RINGERS IV BOLUS
1000.0000 mL | Freq: Once | INTRAVENOUS | Status: AC
  Administered 2023-10-17: 1000 mL via INTRAVENOUS

## 2023-10-17 NOTE — ED Triage Notes (Signed)
C/o cbg-500 at home. Also c/o polydipsia.  Denies n/v, vision changes, weakness.  Pt reports being out of meds for 2-3 weeks.  Hx DM

## 2023-10-17 NOTE — Discharge Instructions (Addendum)

## 2023-10-17 NOTE — ED Provider Notes (Signed)
[facility] Provider Note   CSN: [id number] Arrival date & time: 10/17/23  2129     History  Chief Complaint  Patient presents with   Hyperglycemia    Morgan [name] is a 62 y.o. female.  PMH of diabetes, supposed to take metformin and she believes either Januvia or Janumet she is not sure.  Presents the ER today for high blood sugar and excessive thirst.  She reports that she has not taken her medication in a long time, she thinks at least a few weeks but may be longer than a month.  She states she ran out of her medications and call her doctor to get them refilled" just stopped taking them."  Denies abdominal pain, dysuria, flank pain, fevers or chills, chest pain or shortness of breath, nausea or vomiting or diarrhea.  She denies any other complaints.  Hyperglycemia      Home Medications Prior to Admission medications   Medication Sig Start Date End Date Taking? Authorizing Provider  acetaminophen (TYLENOL) 500 MG tablet Take 1,000 mg by mouth every 6 (six) hours as needed.    [provider]  atorvastatin (LIPITOR) 20 MG tablet Take 20 mg by mouth daily. Patient not taking: No sig reported 08/11/20   [provider]  cephALEXin (KEFLEX) 500 MG capsule Take 1 capsule (500 mg total) by mouth 3 (three) times daily. 09/03/21   Dione Booze, MD  lisinopril (ZESTRIL) 20 MG tablet Take 20 mg by mouth daily. Patient not taking: No sig reported    [provider]  lisinopril-hydrochlorothiazide (PRINZIDE,ZESTORETIC) 10-12.5 MG per tablet Take 1 tablet by mouth daily.  Patient not taking: No sig reported    [provider]  meloxicam (MOBIC) 7.5 MG tablet Take 1 tablet (7.5 mg total) by mouth daily. Patient not taking: No sig reported 09/16/20   Vickki Hearing, MD  metFORMIN (GLUCOPHAGE) 500 MG tablet SMARTSIG:1 Tablet(s) By Mouth Morning-Evening 09/03/21   Dione Booze, MD  omeprazole (PRILOSEC) 20 MG capsule  TAKE 1 CAPSULE BY MOUTH EVERY DAY Patient not taking: No sig reported 10/23/20   Vickki Hearing, MD      Allergies    Omeprazole and Aspirin    Review of Systems   Review of Systems  Physical Exam Updated Vital Signs BP (!) 146/82   Pulse (!) 102   Temp 98 F (36.7 C)   Resp 20   Wt 72 kg   LMP 04/06/2013   SpO2 100%   BMI 24.86 kg/m  Physical Exam Vitals and nursing note reviewed.  Constitutional:      General: She is not in acute distress.    Appearance: She is well-developed.  HENT:     Head: Normocephalic and atraumatic.  Eyes:     Conjunctiva/sclera: Conjunctivae normal.  Cardiovascular:     Rate and Rhythm: Normal rate and regular rhythm.     Heart sounds: No murmur heard. Pulmonary:     Effort: Pulmonary effort is normal. No respiratory distress.     Breath sounds: Normal breath sounds.  Abdominal:     Palpations: Abdomen is soft.     Tenderness: There is no abdominal tenderness.  Musculoskeletal:        General: No swelling.     Cervical back: Neck supple.  Skin:    General: Skin is warm and dry.     Capillary Refill: Capillary refill takes less than 2 seconds.  Neurological:  General: No focal deficit present.     Mental Status: She is alert and oriented to person, place, and time.  Psychiatric:        Mood and Affect: Mood normal.     ED Results / Procedures / Treatments   Labs (all labs ordered are listed, but only abnormal results are displayed) Labs Reviewed  CBG MONITORING, ED - Abnormal; Notable for the following components:      Result Value   Glucose-Capillary 476 (*)    All other components within normal limits  BASIC METABOLIC PANEL  CBC WITH DIFFERENTIAL/PLATELET  URINALYSIS, ROUTINE W REFLEX MICROSCOPIC    EKG None  Radiology No results found.  Procedures Procedures    Medications Ordered in ED Medications  lactated ringers bolus 1,000 mL (has no administration in time range)    ED Course/ Medical Decision  Making/ A&P                                 Medical Decision Making This patient presents to the ED for concern of hyperglycemia, out of meds, this involves an extensive number of treatment options, and is a complaint that carries with it a high risk of complications and morbidity.  The differential diagnosis includes hyperglycemia, HHS, DKA, medication noncompliance, electrolyte derangement, renal impairment, other   Co morbidities that complicate the patient evaluation :   DM type II   Additional history obtained:  Additional history obtained from EMR External records from outside source obtained and reviewed including notes, labs, medication lists   Lab Tests:  I Ordered, and personally interpreted labs.  The pertinent results include:  Hyperglycemic with a normal anion gap and CO2, GFR is around her baseline, sodium corrected for hyperglycemia is 131     Problem List / ED Course / Critical interventions / Medication management  Patient is hyperglycemic, overall stable well-appearing likely due to her medication noncompliance.  She has established PCP follow-up.  Encouraged to follow-up with them, will give her small dose of insulin and recheck her blood sugar. Signed out to Dr. Clayborne Dana      Amount and/or Complexity of Data Reviewed Labs: ordered.          Final Clinical Impression(s) / ED Diagnoses Final diagnoses:  None    Rx / DC Orders ED Discharge Orders     None         Josem Kaufmann 10/17/23 2306    Eber Hong, MD 10/18/23 [phone]

## 2023-10-17 NOTE — ED Provider Notes (Signed)
11:04 PM Assumed care from [facility] PA and Dr. Hyacinth Meeker, please see their note for full history, physical and decision making until this point. In brief this is a 62 y.o. year old female who presented to the ED tonight with Hyperglycemia     Hyperglycemia with no gap. Sodium low but corrected for sugar is around 130. Getting fluids and insulin for same. Pending recheck.   Discharge instructions, including strict return precautions for new or worsening symptoms, given. Patient and/or family verbalized understanding and agreement with the plan as described.   Labs, studies and imaging reviewed by myself and considered in medical decision making if ordered. Imaging interpreted by radiology.  Labs Reviewed  BASIC METABOLIC PANEL - Abnormal; Notable for the following components:      Result Value   Sodium 121 (*)    Chloride 87 (*)    Glucose, Bld 514 (*)    BUN 31 (*)    Creatinine, Ser 1.46 (*)    Calcium 8.5 (*)    GFR, Estimated 41 (*)    All other components within normal limits  CBG MONITORING, ED - Abnormal; Notable for the following components:   Glucose-Capillary 476 (*)    All other components within normal limits  CBC WITH DIFFERENTIAL/PLATELET  URINALYSIS, ROUTINE W REFLEX MICROSCOPIC    No orders to display    No follow-ups on file.

## 2023-10-18 LAB — CBG MONITORING, ED: Glucose-Capillary: 297 mg/dL — ABNORMAL HIGH (ref 70–99)

## 2023-10-18 MED ORDER — METFORMIN HCL 500 MG PO TABS
1000.0000 mg | ORAL_TABLET | Freq: Once | ORAL | Status: AC
  Administered 2023-10-18: 1000 mg via ORAL
  Filled 2023-10-18: qty 2

## 2023-10-18 MED ORDER — LACTATED RINGERS IV BOLUS
1000.0000 mL | Freq: Once | INTRAVENOUS | Status: AC
  Administered 2023-10-18: 1000 mL via INTRAVENOUS

## 2023-10-18 MED ORDER — METFORMIN HCL 1000 MG PO TABS: 1000.0000 mg | ORAL_TABLET | Freq: Two times a day (BID) | ORAL | 0 refills | Status: AC

## 2023-10-18 MED ORDER — LISINOPRIL 20 MG PO TABS: 20.0000 mg | ORAL_TABLET | Freq: Every day | ORAL | 0 refills | Status: AC

## 2024-02-04 ENCOUNTER — Ambulatory Visit (HOSPITAL_COMMUNITY): Payer: Self-pay | Attending: Family Medicine | Admitting: Physical Therapy

## 2024-02-04 DIAGNOSIS — T25121S Burn of first degree of right foot, sequela: Secondary | ICD-10-CM | POA: Insufficient documentation

## 2024-02-04 DIAGNOSIS — M79671 Pain in right foot: Secondary | ICD-10-CM | POA: Insufficient documentation

## 2024-02-04 NOTE — Therapy (Signed)
 OUTPATIENT PHYSICAL THERAPY Wound EVALUATION   Patient Name: Morgan [name] MRN: [id number] DOB:[birth year], 63 y.o., female Today's Date: 02/05/2024   PCP: Morgan Rush, MD REFERRING PROVIDER: Marvine Rush, MD  END OF SESSION:  PT End of Session - 02/05/24 1213     Visit Number 1    Number of Visits 2    Date for PT Re-Evaluation 02/11/24    Progress Note Due on Visit 2    PT Start Time 1347    PT Stop Time 1429    PT Time Calculation (min) 42 min    Equipment Utilized During Treatment Gait belt    Activity Tolerance Patient tolerated treatment well    Behavior During Therapy WFL for tasks assessed/performed             Past Medical History:  Diagnosis Date   Cataract    Diabetes mellitus without complication (HCC)    Disorder of vitamin B12 04/17/2013   Elevated while taking 1000 mcg of Vitamin B12 PO   Hyperlipidemia    Hypertension    Past Surgical History:  Procedure Laterality Date   BILATERAL KNEE ARTHROSCOPY Bilateral    BREAST BIOPSY Left    Benign   CATARACT EXTRACTION W/ INTRAOCULAR LENS IMPLANT Bilateral    Patient Active Problem List   Diagnosis Date Noted   Charcot foot due to diabetes mellitus (HCC) 07/23/2017   Diabetes mellitus (HCC) 07/23/2017   Hypertension 07/23/2017   Diabetes mellitus type II, controlled, with no complications (HCC) 04/19/2013   Disorder of vitamin B12 04/17/2013    ONSET DATE: 1.20.25  REFERRING DIAG:  Free Text Procedure Description   diabetic ulcer        THERAPY DIAG:  Diabletic ulcer  Rationale for Evaluation and Treatment: Rehabilitation     Wound Therapy - 02/05/24 0001     Subjective PT states that two weeks ago she was so cold she put a heating pad on her foot and fell asleep.  When she awoke she had a blister on her heel    Patient and Family Stated Goals wound to heal as pt is diabetic    Date of Onset 01/26/24    Prior Treatments self care, MD  antibiotic    Pain Scale 0-10    Pain Score 3      Pain Type Acute pain    Pain Location Foot    Evaluation and Treatment Procedures Explained to Patient/Family Yes    Evaluation and Treatment Procedures agreed to    Wound Properties Date First Assessed: 02/04/24 Time First Assessed: 1350   Wound Image Images linked: 1    Dressing Type Gauze (Comment)    Dressing Changed Changed    Dressing Status Old drainage    Dressing Change Frequency PRN    Site / Wound Assessment Brown;Dry    % Wound base Red or Granulating 0%    % Wound base Other/Granulation Tissue (Comment) 100%    Peri-wound Assessment Intact    Wound Length (cm) 7 cm    Wound Width (cm) 9 cm    Wound Depth (cm) 0 cm    Wound Volume (cm^3) 0 cm^3    Wound Surface Area (cm^2) 63 cm^2    Drainage Amount None    Treatment Cleansed;Debridement (Selective)    Selective Debridement (non-excisional) - Location entire heel    Selective Debridement (non-excisional) - Tools Used Forceps;Scissors    Selective Debridement (non-excisional) - Tissue Removed devitalized tissue    Wound Therapy -  Clinical Statement see below    Wound Therapy - Functional Problem List ambulation    Factors Delaying/Impairing Wound Healing Diabetes Mellitus    Hydrotherapy Plan Debridement;Dressing change    Wound Therapy - Frequency --   1 x a week   Wound Therapy - Current Recommendations PT    Wound Plan debridement and dressing change    Dressing  xeroform, 4x4 and kerlix               PATIENT EDUCATION: Education details: keep dressing dry, dressing should be comfortable if increase pain take dressing off.  Person educated: Patient Education method: Explanation Education comprehension: verbalized understanding   HOME EXERCISE PROGRAM: None at this time  GOALS: Goals reviewed with patient? No  SHORT TERM GOALS: Target date: 02/25/24  Pt wound to be 100% granulated Baseline: Goal status: INITIAL  2.  Pt pain to be no greater than a 0/10 Baseline:  Goal status:  INITIAL     ASSESSMENT:  CLINICAL IMPRESSION: Patient is a 63 y.o. female who was seen today for physical therapy evaluation and treatment for diabetic ulcer. Evaluation demonstrates second degree healing burnwith increased pain and decreased activity level.  The therapist was able to debride all non vitalized tissue to expose only a small area of approximately 4x3 cm that is not healed but is well granulated.  Pt very anxious about not coming back therefore will schedule one more visit.  However, therapist explained that by next week pt might be comfortable enough to cancel the appointment.   Morgan [name] will benefit from skilled PT to address these issues and create a healing environment for her wound.   OBJECTIVE IMPAIRMENTS: increased edema, pain, and decreased skin integrity  .   ACTIVITY LIMITATIONS: bathing, dressing, hygiene/grooming, and locomotion level  PARTICIPATION LIMITATIONS: cleaning, shopping, and community activity  PERSONAL FACTORS: 1 comorbidity: DM  are also affecting patient's functional outcome.   REHAB POTENTIAL: Good  CLINICAL DECISION MAKING: Evolving/moderate complexity  EVALUATION COMPLEXITY: Moderate  PLAN: PT FREQUENCY: 2x/week  PT DURATION: 6 weeks  PLANNED INTERVENTIONS: 97110-Therapeutic exercises, 97535- Self Care, 02859- Manual therapy, 97597- Wound care (first 20 sq cm), and Patient/Family education  PLAN FOR NEXT SESSION: continue with debridement and dressing change as needed to continue optimal wound healing environment.    Morgan [name], PT CLT [phone]  02/05/2024, 12:20 PM

## 2024-02-05 ENCOUNTER — Other Ambulatory Visit: Payer: Self-pay

## 2024-02-08 ENCOUNTER — Ambulatory Visit (HOSPITAL_COMMUNITY): Payer: Self-pay

## 2024-02-09 ENCOUNTER — Ambulatory Visit (HOSPITAL_COMMUNITY): Payer: Self-pay | Admitting: Physical Therapy

## 2024-02-15 ENCOUNTER — Ambulatory Visit (HOSPITAL_COMMUNITY): Payer: Self-pay | Admitting: Physical Therapy

## 2024-02-18 ENCOUNTER — Ambulatory Visit (HOSPITAL_COMMUNITY): Payer: Self-pay

## 2024-02-22 ENCOUNTER — Ambulatory Visit (HOSPITAL_COMMUNITY): Payer: Self-pay

## 2024-02-25 ENCOUNTER — Ambulatory Visit (HOSPITAL_COMMUNITY): Payer: Self-pay

## 2024-02-29 ENCOUNTER — Ambulatory Visit (HOSPITAL_COMMUNITY): Payer: Self-pay | Admitting: Physical Therapy

## 2024-03-02 ENCOUNTER — Ambulatory Visit (HOSPITAL_COMMUNITY): Payer: Self-pay | Admitting: Physical Therapy

## 2024-03-07 ENCOUNTER — Ambulatory Visit (HOSPITAL_COMMUNITY): Payer: Self-pay | Admitting: Physical Therapy

## 2024-03-10 ENCOUNTER — Ambulatory Visit (HOSPITAL_COMMUNITY): Payer: Self-pay | Admitting: Physical Therapy

## 2024-03-17 ENCOUNTER — Ambulatory Visit (HOSPITAL_COMMUNITY): Payer: Self-pay

## 2024-05-11 NOTE — Patient Instructions (Incomplete)

## 2024-05-18 ENCOUNTER — Ambulatory Visit: Payer: Self-pay | Admitting: Nurse Practitioner

## 2024-05-18 DIAGNOSIS — Z7984 Long term (current) use of oral hypoglycemic drugs: Secondary | ICD-10-CM

## 2024-05-18 DIAGNOSIS — E1165 Type 2 diabetes mellitus with hyperglycemia: Secondary | ICD-10-CM

## 2024-05-18 DIAGNOSIS — Z794 Long term (current) use of insulin: Secondary | ICD-10-CM

## 2024-05-18 DIAGNOSIS — I10 Essential (primary) hypertension: Secondary | ICD-10-CM

## 2024-05-18 DIAGNOSIS — Z91199 Patient's noncompliance with other medical treatment and regimen due to unspecified reason: Secondary | ICD-10-CM

## 2024-05-18 DIAGNOSIS — E782 Mixed hyperlipidemia: Secondary | ICD-10-CM
